# Patient Record
Sex: Male | Born: 1970 | Race: Black or African American | Hispanic: No | Marital: Married | Smoking: Never smoker
Health system: Southern US, Community
[De-identification: ages and names within clinical notes are randomized; demographics above are authoritative.]

## PROBLEM LIST (undated history)

## (undated) ENCOUNTER — Emergency Department (HOSPITAL_COMMUNITY): Admission: EM | Payer: Self-pay | Source: Home / Self Care

## (undated) DIAGNOSIS — M545 Low back pain, unspecified: Secondary | ICD-10-CM

## (undated) DIAGNOSIS — I10 Essential (primary) hypertension: Secondary | ICD-10-CM

## (undated) DIAGNOSIS — R030 Elevated blood-pressure reading, without diagnosis of hypertension: Secondary | ICD-10-CM

## (undated) DIAGNOSIS — E669 Obesity, unspecified: Secondary | ICD-10-CM

## (undated) DIAGNOSIS — G4733 Obstructive sleep apnea (adult) (pediatric): Secondary | ICD-10-CM

## (undated) HISTORY — DX: Obesity, unspecified: E66.9

## (undated) HISTORY — DX: Low back pain: M54.5

## (undated) HISTORY — PX: TONSILECTOMY, ADENOIDECTOMY, BILATERAL MYRINGOTOMY AND TUBES: SHX2538

## (undated) HISTORY — DX: Elevated blood-pressure reading, without diagnosis of hypertension: R03.0

## (undated) HISTORY — DX: Obstructive sleep apnea (adult) (pediatric): G47.33

## (undated) HISTORY — DX: Low back pain, unspecified: M54.50

## (undated) HISTORY — DX: Essential (primary) hypertension: I10

---

## 2001-04-14 ENCOUNTER — Emergency Department (HOSPITAL_COMMUNITY): Admission: EM | Admit: 2001-04-14 | Discharge: 2001-04-14 | Payer: Self-pay

## 2001-10-18 ENCOUNTER — Encounter: Payer: Self-pay | Admitting: Emergency Medicine

## 2001-10-18 ENCOUNTER — Emergency Department (HOSPITAL_COMMUNITY): Admission: EM | Admit: 2001-10-18 | Discharge: 2001-10-18 | Payer: Self-pay | Admitting: Emergency Medicine

## 2006-11-21 ENCOUNTER — Emergency Department (HOSPITAL_COMMUNITY): Admission: EM | Admit: 2006-11-21 | Discharge: 2006-11-21 | Payer: Self-pay | Admitting: Emergency Medicine

## 2007-01-13 ENCOUNTER — Encounter: Admission: RE | Admit: 2007-01-13 | Discharge: 2007-01-13 | Payer: Self-pay | Admitting: Internal Medicine
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
## 2010-07-12 ENCOUNTER — Emergency Department (HOSPITAL_COMMUNITY): Admission: EM | Admit: 2010-07-12 | Discharge: 2010-07-12 | Payer: Self-pay | Admitting: Emergency Medicine

## 2011-10-01 DIAGNOSIS — IMO0001 Reserved for inherently not codable concepts without codable children: Secondary | ICD-10-CM

## 2011-10-01 HISTORY — DX: Reserved for inherently not codable concepts without codable children: IMO0001

## 2013-06-06 ENCOUNTER — Telehealth: Payer: Self-pay | Admitting: General Surgery

## 2013-06-06 NOTE — Telephone Encounter (Signed)
Message copied by Nita Sells on Wed Jun 06, 2013 11:01 AM ------      Message from: Armanda Magic R      Created: Wed May 30, 2013  8:58 PM       Please let patient know that he had successful CPAP titration and set up CPAP with [facility] with Resmed CPAP at 14cm H2O with heated humidity and medium Resmed Airfit F10 mask and set up OV with me in 10 weeks ------

## 2013-06-06 NOTE — Telephone Encounter (Signed)
Pt is aware. Faxed to [employer]. To scheduling to schedule 10 week f/u

## 2013-06-14 ENCOUNTER — Encounter: Payer: Self-pay | Admitting: General Surgery

## 2013-06-14 ENCOUNTER — Telehealth: Payer: Self-pay | Admitting: General Surgery

## 2013-06-14 NOTE — Telephone Encounter (Signed)
successful CPAP titration and set up CPAP with [facility] with Resmed CPAP at 14cm H2O with heated humidity and medium Resmed Airfit F10 mask and set up OV with me in 10 weeks   LVM for pt to return call.

## 2013-06-14 NOTE — Telephone Encounter (Signed)
Message copied by Nita Sells on Thu Jun 14, 2013  9:36 AM ------      Message from: Armanda Magic R      Created: Wed May 30, 2013  8:58 PM       Please let patient know that he had successful CPAP titration and set up CPAP with [facility] with Resmed CPAP at 14cm H2O with heated humidity and medium Resmed Airfit F10 mask and set up OV with me in 10 weeks ------

## 2013-06-14 NOTE — Telephone Encounter (Signed)
Sent letter for pt to call us and [facility] due to many unreturned calls.

## 2013-08-08 ENCOUNTER — Telehealth: Payer: Self-pay | Admitting: Cardiology

## 2013-08-08 NOTE — Telephone Encounter (Signed)
Spoke with pt and he has not received his CPAP machine yet. I called and spoke to [facility] and she stated that he has missed two CPAP set up appointments. She stated she would call him to r/s appointment. I will r/s his 10 week f/u for another ten weeks from now.

## 2013-08-08 NOTE — Telephone Encounter (Signed)
New Message  Pt called was unaware of why he had a follow up appt on 12/15.Marland Kitchen Requests a call back to discuss why this appt is needed// Please call

## 2013-08-08 NOTE — Telephone Encounter (Signed)
[facility] and r/s appt for 10/22/13 at 3:45

## 2013-08-13 ENCOUNTER — Ambulatory Visit: Payer: Self-pay | Admitting: Cardiology

## 2013-10-19 ENCOUNTER — Encounter: Payer: Self-pay | Admitting: General Surgery

## 2013-10-19 DIAGNOSIS — G4733 Obstructive sleep apnea (adult) (pediatric): Secondary | ICD-10-CM

## 2013-10-22 ENCOUNTER — Ambulatory Visit: Payer: Self-pay | Admitting: Cardiology

## 2014-05-30 ENCOUNTER — Encounter: Payer: Self-pay | Admitting: Cardiology

## 2015-02-13 ENCOUNTER — Ambulatory Visit (INDEPENDENT_AMBULATORY_CARE_PROVIDER_SITE_OTHER): Payer: Self-pay | Admitting: Emergency Medicine

## 2015-02-13 VITALS — BP 130/80 | HR 77 | Temp 98.8°F | Resp 16 | Ht 72.5 in | Wt 309.0 lb

## 2015-02-13 DIAGNOSIS — Z024 Encounter for examination for driving license: Secondary | ICD-10-CM

## 2015-02-13 DIAGNOSIS — Z021 Encounter for pre-employment examination: Secondary | ICD-10-CM

## 2015-02-13 NOTE — Progress Notes (Signed)
RANEY SCHUBBE March 17, 1971 44 y.o.   Chief Complaint  Patient presents with  . Employment Physical    DOT       History of Present Illness:  Presents for a DOT examination.    ROS  Review of systems was reviewed and noncontributory  Allergies  Allergen Reactions  . Asa [Aspirin]     Nose bleed   . Bee Venom Swelling  . Penicillins     Childhood allergy      Current medications reviewed and updated. Past medical history, family history, social history have been reviewed and updated.   Physical Exam  GEN: WDWN, NAD, Non-toxic, A & O x 3 HEENT: Atraumatic, Normocephalic. Neck supple. No masses, No LAD. Ears and Nose: No external deformity. CV: RRR, No M/G/R. No JVD. No thrill. No extra heart sounds. PULM: CTA B, no wheezes, crackles, rhonchi. No retractions. No resp. distress. No accessory muscle use. ABD: S, NT, ND, +BS. No rebound. No HSM. EXTR: No c/c/e NEURO Normal gait.  PSYCH: Normally interactive. Conversant. Not depressed or anxious appearing.  Calm demeanor.   Assessment and Plan:  DOT examination. She has sleep apnea and did not bring a report of his use of the CPAP machine setting will follow up in the next 90 days and bring the reports when he extended his card for 4 year.  Carmelina Dane, M.D. Staff Physician [facility]

## 2017-11-09 ENCOUNTER — Encounter (INDEPENDENT_AMBULATORY_CARE_PROVIDER_SITE_OTHER): Payer: Self-pay

## 2017-11-09 ENCOUNTER — Encounter: Payer: Self-pay | Admitting: Neurology

## 2017-11-09 ENCOUNTER — Ambulatory Visit (INDEPENDENT_AMBULATORY_CARE_PROVIDER_SITE_OTHER): Payer: Managed Care, Other (non HMO) | Admitting: Neurology

## 2017-11-09 VITALS — BP 136/86 | HR 69 | Ht 74.0 in | Wt 295.0 lb

## 2017-11-09 DIAGNOSIS — G4726 Circadian rhythm sleep disorder, shift work type: Secondary | ICD-10-CM

## 2017-11-09 DIAGNOSIS — G4733 Obstructive sleep apnea (adult) (pediatric): Secondary | ICD-10-CM | POA: Diagnosis not present

## 2017-11-09 DIAGNOSIS — Z72820 Sleep deprivation: Secondary | ICD-10-CM | POA: Insufficient documentation

## 2017-11-09 DIAGNOSIS — G4719 Other hypersomnia: Secondary | ICD-10-CM | POA: Insufficient documentation

## 2017-11-09 NOTE — Patient Instructions (Signed)

## 2017-11-09 NOTE — Progress Notes (Signed)
SLEEP MEDICINE CLINIC   Provider:  Melvyn Novas, M D  Primary Care Physician:  Farris Has, MD   Referring Provider: Farris Has, MD   Chief Complaint  Patient presents with  . New Patient (Initial Visit)    pt wife, rm 20. pt states he had a sleep sutdy in 2014 and has been on CPAP since. he had issues with insurance problems with getting supplies and has not used the current CPAP in over a year. pt here to restart use machine. and get another sleep study.     HPI:  Shaun Scott is a 47 y.o. male patient formerly followed by Dr Earl Gala, and seen here in a referral from Dr. Farris Has for a new evaluation of sleep apnea.  I have the pleasure of meeting with Mr. and Mrs. Olander today on 09 November 2017, in order to help the patient reestablish with CPAP use if necessary.  [name] was evaluated at [facility] physicians in May 2014 for sleep apnea, his interpreting physician was Dr. Carolanne Grumbling.  At the time he was 47 years old, had a BMI of 37, and a high depression score.  He felt daytime sleepy.  Sleep efficiency during the test was 93% which is very high, REM onset after 86 minutes, sleep onset after only 2 minutes.  The patient slept equal amount of time on his back and on his sides, he had 14 central apneas, only 3 obstructive apneas and 77 hypo-apneas.  The lowest oxygen was 73% SPO2 and during REM sleep 68% SPO2.  His AHI was 15.3, and REM sleep exacerbated to 32.6, and his respiratory disturbance index was 22.1 and during REM sleep 35.8.  Given that the patient had prolonged oxygen desaturation REM exacerbation I would have considered him in the moderate to severe obstructive sleep apnea category due to his comorbidity.  Dr. Mayford Knife recommended a CPAP titration the patient returned for a CPAP titration of which I have no records.   he was prescribed CPAP after his sleep study evaluation, and was placed on an air sense ResMed #10 model CPAP.   He used it last in August 2018, after  running out of supplies.  The patient reports that his insurance company and the durable medical equipment company seem not to work together, he was asked to cover expenses that he could not at the time and thus became noncompliant.  The pressure for his machine was set at 14 cmH2O, his residual AHI was very low 0.5/h he would some nights have high air leaks, but overall CPAP was not uncomfortable for him.   His co-morbidities are listed as obesity, HTN, impaired glucose metabolism, back pain, shift work.   Chief complaint according to patient : " I work 2 jobs and sleep is fragmented "   Sleep habits are as follows: 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 He has commute for this one - 35 minutes.  This leaves only the morning from 8 AM to noon to sleep and his one hour lunch break.  He reports being able to sleep in the break room. He sleeps 4-5 hours at home.  His wife reports each and every time to snore. It's louder than in his  twenties. He sleeps in a cool, quiet and dark bedroom. Multiple pillows, and on his side , switches to supine.  Sometimes sleeping prone.   Sleep medical history and family sleep history: cousin maternal with OSA, Parents were snoring, mother was a shift Financial controller. Daytime sleepy.  He is a long time shift worker- and sleep deprived - ever since August 2018. Had a tonsillectomy in childhood, one event of sleep walking, one time night terrors.    Social history:  Works 2 jobs, regularly working nights. Sleeps in the day.  Married. One daughter at home. No smoking, non drinker, no drugs, drinks coffee 2-3/ day, soda in PM, iced tea- none. 5 hour- energy drink some days.   Review of Systems: Out of a complete 14 system review, the  patient complains of only the following symptoms, and all other reviewed systems are negative.   Loud snoring, bathroom breaks, high caffeine user.  Epworth score 9- 14 points / 24  , Fatigue severity score 38  , depression score n/a    Social History   Socioeconomic History  . Marital status: Married    Spouse name: Not on file  . Number of children: Not on file  . Years of education: Not on file  . Highest education level: Not on file  Social Needs  . Financial resource strain: Not on file  . Food insecurity - worry: Not on file  . Food insecurity - inability: Not on file  . Transportation needs - medical: Not on file  . Transportation needs - non-medical: Not on file  Occupational History  . Not on file  Tobacco Use  . Smoking status: Never Smoker  . Smokeless tobacco: Never Used  Substance and Sexual Activity  . Alcohol use: No  . Drug use: No  . Sexual activity: Not on file  Other Topics Concern  . Not on file  Social History Narrative  . Not on file    Family History  Problem Relation Age of Onset  . Hypertension Mother   . Diabetes Mother   . CVA Mother   . Kidney cancer Mother   . Asthma Father   . Asthma Brother   . Testicular cancer Brother   . Prostate cancer Brother     Past Medical History:  Diagnosis Date  . Elevated BP 10/2011   reading with out dx of HTN  . Hypertension   . Low back pain    Followed by Chiropractor  . Obesity   . Obstructive sleep apnea     Past Surgical History:  Procedure Laterality Date  . TONSILECTOMY, ADENOIDECTOMY, BILATERAL MYRINGOTOMY AND TUBES      Current Outpatient Medications  Medication Sig Dispense Refill  . EPINEPHrine (EPIPEN IJ) Inject as directed.    . hydrochlorothiazide (HYDRODIURIL) 25 MG tablet Take 25 mg by mouth daily.    . Multiple Vitamins-Minerals (MULTIVITAMIN GUMMIES ADULT PO) Take by mouth.     No current facility-administered medications for this visit.     Allergies as of  11/09/2017 - Review Complete 11/09/2017  Allergen Reaction Noted  . Asa [aspirin]  10/19/2013  . Bee venom Swelling 10/19/2013  . Penicillins  10/19/2013    Vitals: BP 136/86   Pulse 69   Ht 6\' 2"  (1.88 m)   Wt 295 lb (133.8 kg) Comment: wants it noted that he wearing steel toe boots  BMI 37.88 kg/m  Last Weight:  Wt Readings from Last 1 Encounters:  11/09/17 295 lb (133.8 kg)  ZOX:WRUE mass index is 37.88 kg/m.     Last Height:   Ht Readings from Last 1 Encounters:  11/09/17 6\' 2"  (1.88 m)    Physical exam:  General: The patient is awake, alert and appears not in acute distress. The patient is well groomed. Head: Normocephalic, atraumatic. Neck is supple. Mallampati 5 neck circumference: 18.25" Nasal airflow patent ,   Cardiovascular:  Regular rate and rhythm, without  murmurs or carotid bruit, and without distended neck veins. Respiratory: Lungs are clear to auscultation. Skin:  Without evidence of edema, or rashTrunk: BMI is 38. The patient's posture is erect.  Neurologic exam : The patient is drowsy, but oriented to place and time.   Memory subjective described as intact. Attention span & concentration ability appears normal.  Speech is fluent. Mood and affect are appropriate.  Cranial nerves: No change in smell and taste. Pupils are equal and briskly reactive to light. Funduscopic exam without evidence of pallor or edema. He reports vision with astigmatism and needs reading glasses. Extraocular movements  in vertical and horizontal planes intact and without nystagmus. Visual fields by finger perimetry are intact. Hearing to finger rub intact.  Facial sensation intact to fine touch. Facial motor strength is symmetric, but the patient has a right sided ptosis,  tongue and uvula move midline. Shoulder shrug was symmetrical.   Motor exam:   Normal tone, muscle bulk and symmetric strength in all extremities. Sensory:  Fine touch, pinprick and vibration were tested in all  extremities. Proprioception tested in the upper extremities was normal. Coordination: Rapid alternating movements in the fingers/hands was normal. Finger-to-nose maneuver  normal without evidence of ataxia, dysmetria or tremor. Gait and station: Patient walks without assistive device and is able unassisted to climb up to the exam table. Strength within normal limits. Stance is stable and normal. Turns with 3  Steps. Romberg testing is negative. Deep tendon reflexes: in the  upper and lower extremities are symmetric and intact.   Assessment:  After physical and neurologic examination, review of laboratory studies,  Personal review of imaging studies, reports of other /same  Imaging studies, results of polysomnography and / or neurophysiology testing and pre-existing records as far as provided in visit., my assessment is   1)    I will need to re-evaluate patient for the presence of OSA  In order to renew his CPAP supplies.  His wife described loud , thunderous snoring and high degree of sleepiness, more than he acknowleged.    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    3) His CPAP compliance will be problematic given his double job and limited sleep time.   The patient was advised of the nature of the diagnosed disorder , the treatment options and the  risks for general health and wellness arising from not treating the condition.   I spent more than 45 minutes of face to face time with the patient.  Greater than 50% of time was spent in counseling and coordination of care. We have discussed the diagnosis and differential and I answered the patient's questions.    Plan:  Treatment plan and additional workup :  1) Shift work sleep disorder- can only attend sleep lab Thursday and Friday . Will order HST instead.   2) He has all risk factors for apnea - BMI remains high, high neck size, Mallampati, and  witnessed snoring and apnea.  3) Both factors contribute to an Epworth of over 14 points.   DME was [facility],his CPA{P machine model by ResMed  Is an air sense 10 , he was a Full face mask user with facial hair- may be able to change a nasal mask.     Melvyn NovasARMEN Igor Bishop, MD 11/09/2017, 11:12 AM  Certified in Neurology by ABPN Certified in Sleep Medicine by [facility]  [facility] [address], [address] [place], [place] [id number]

## 2017-12-14 ENCOUNTER — Ambulatory Visit (INDEPENDENT_AMBULATORY_CARE_PROVIDER_SITE_OTHER): Payer: Managed Care, Other (non HMO) | Admitting: Neurology

## 2017-12-14 DIAGNOSIS — G4726 Circadian rhythm sleep disorder, shift work type: Secondary | ICD-10-CM

## 2017-12-14 DIAGNOSIS — G4733 Obstructive sleep apnea (adult) (pediatric): Secondary | ICD-10-CM

## 2017-12-14 DIAGNOSIS — Z72820 Sleep deprivation: Secondary | ICD-10-CM

## 2017-12-14 DIAGNOSIS — G4719 Other hypersomnia: Secondary | ICD-10-CM

## 2017-12-27 ENCOUNTER — Other Ambulatory Visit: Payer: Self-pay | Admitting: Neurology

## 2017-12-27 ENCOUNTER — Telehealth: Payer: Self-pay | Admitting: Neurology

## 2017-12-27 NOTE — Telephone Encounter (Signed)
I called pt. I advised pt that Dr. Vickey Huger reviewed their sleep study results and found that pt has sleep apnea. Dr. Vickey Huger recommends that pt starts a auto CPAP. I reviewed PAP compliance expectations with the pt. Pt is agreeable to starting a CPAP. I advised pt that an order will be sent to a DME, [facility], and [facility] will call the pt within about one week after they file with the pt's insurance. [facility] will show the pt how to use the machine, fit for masks, and troubleshoot the CPAP if needed. A follow up appt was made for insurance purposes with Darrol Angel on Apr 05, 2018 at 8:45 am . Pt verbalized understanding to arrive 15 minutes early and bring their CPAP. A letter with all of this information in it will be mailed to the pt as a reminder. I verified with the pt that the address we have on file is correct. Pt verbalized understanding of results. Pt had no questions at this time but was encouraged to call back if questions arise.

## 2017-12-27 NOTE — Telephone Encounter (Signed)
-----   Message from Melvyn Novas, MD sent at 12/27/2017 11:26 AM EDT ----- This HST resulted in mild sleep apnea, and no clinically significant hypoxemia.  Weight loss is the best and long term treatment , and immediate therapy by use of a CPAP or dental device. I will offer an auto CPAP 5-12 cm water with nasal mask or pillow and heated humidity- patient will be asked to use this machine for 4 hours or more each day. Baird Lyons, please let him know that we can also arrange for a medical weight management program.     Cc Dr Kateri Plummer,

## 2017-12-27 NOTE — Procedures (Signed)
NAME:    [name]                                                                 DOB: August 08, 1971 MEDICAL RECORD No: [id number]                                             DOS: 12/14/2017 REFERRING PHYSICIAN: Farris Has, M.D. STUDY PERFORMED: Home Sleep Study on Apnea Link HISTORY: Shaun Scott is a 47 y.o. male patient formerly followed by Dr. Earl Gala, and seen here in a referral from Dr. Farris Has for a new evaluation of sleep apnea. I have the pleasure of meeting with [name] today on 09 November 2017, in order to help the patient reestablish with CPAP use if necessary. Patient is a shift worker with thunderous snoring.  PS:  Mr. Milliron was evaluated at [facility] physicians in May 2014 for sleep apnea, his interpreting physician was Dr. Carolanne Grumbling.  At the time he was 47 years old, had a BMI of 37, and a high depression score.  He felt daytime sleepy. Sleep efficiency during the test was 93% which is very high, REM onset after 86 minutes, sleep onset after only 2 minutes, he had 14 central apneas, only 3 obstructive apneas and 77 hypo-apneas.  The Nadir was 73% SpO2 and during REM sleep 68% SpO2.  His AHI was 15.3/h, and REM sleep exacerbated to 32.6, and his respiratory disturbance index was 22.1 and during REM sleep 35.8.  He had been prescribed CPAP after his sleep study evaluation, and was placed on FF and an air sense ResMed #10 model CPAP.  His co-morbidities are listed as obesity, HTN, impaired glucose metabolism, back pain, shift work. Epworth Sleepiness score endorsed at 9- 14 points / 24, Fatigue severity score at 38 points, and BMI is now 37.8.   STUDY RESULTS:  Total Recording Time:  6 h, 41 min. with valid test time of 3 hours and 45 minutes.  Total Apnea/Hypopnea Index (AHI): 6.9 /h; RDI was 11.5 /h. Average Oxygen Saturation:  94%; Lowest Oxygen Desaturation: 79 %  Total Time in Oxygen Saturation below 89 % was 3.0 minutes. Average Heart Rate:  65 bpm, regular (between  56 and 103 bpm). IMPRESSION: Very mild sleep apnea at AHI 6.9/h. No prolonged oxygen desaturation periods. Normal sinus rhythm and heart rate variability.  RECOMMENDATION: Primary treatment for this mild degree of sleep apnea with moderate snoring is weight loss. If the patient is excessively daytime sleepy, treatment with CPAP is indicated. The absence of hypoxemia would allow for a dental device to be used in apnea treatment.   Please let Mr. Skibicki know that we can immediately start on an auto CPAP 5-12 cm water, interface of choice ( facial hair - avoid FFM )  and comfort and offer to refer to a medical weight management program.  I certify that I have reviewed the raw data recording prior to the issuance of this report in accordance with the standards of the American Academy of Sleep Medicine (AASM). Melvyn Novas, M.D.    12-27-2017  Medical Director of [facility], accredited by the AASM. Diplomat of the ABPN and ABSM.

## 2017-12-27 NOTE — Addendum Note (Signed)
Addended by: Melvyn Novas on: 12/27/2017 11:26 AM   Modules accepted: Orders

## 2018-04-04 NOTE — Progress Notes (Deleted)
[facility]  PATIENT: Shaun Scott DOB: [birth year]   REASON FOR VISIT: Follow-up for newly diagnosed obstructive sleep apnea with initial CPAP HISTORY FROM:    HISTORY OF PRESENT ILLNESS: 3/13/19[name] is a 47 y.o. male patient formerly followed by Dr Earl Gala, and seen here in a referral from Dr. Farris Has for a new evaluation of sleep apnea.  I have the pleasure of meeting with Mr. and Mrs. Scott today on 09 November 2017, in order to help the patient reestablish with CPAP use if necessary.  Shaun Scott was evaluated at [facility] physicians in May 2014 for sleep apnea, his interpreting physician was Dr. Carolanne Grumbling.  At the time he was 47 years old, had a BMI of 37, and a high depression score.  He felt daytime sleepy.  Sleep efficiency during the test was 93% which is very high, REM onset after 86 minutes, sleep onset after only 2 minutes.  The patient slept equal amount of time on his back and on his sides, he had 14 central apneas, only 3 obstructive apneas and 77 hypo-apneas.  The lowest oxygen was 73% SPO2 and during REM sleep 68% SPO2.  His AHI was 15.3, and REM sleep exacerbated to 32.6, and his respiratory disturbance index was 22.1 and during REM sleep 35.8.  Given that the patient had prolonged oxygen desaturation REM exacerbation I would have considered him in the moderate to severe obstructive sleep apnea category due to his comorbidity.  Dr. Mayford Knife recommended a CPAP titration the patient returned for a CPAP titration of which I have no records.   he was prescribed CPAP after his sleep study evaluation, and was placed on an air sense ResMed #10 model CPAP.   He used it last in August 2018, after running out of supplies.  The patient reports that his insurance company and the durable medical equipment company seem not to work together, he was asked to cover expenses that he could not at the time and thus became noncompliant.  The pressure for his machine was  set at 14 cmH2O, his residual AHI was very low 0.5/h he would some nights have high air leaks, but overall CPAP was not uncomfortable for him.   His co-morbidities are listed as obesity, HTN, impaired glucose metabolism, back pain, shift work.   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 He has commute for this one - 35 minutes.  This leaves only the morning from 8 AM to noon to sleep and his one hour lunch break.  He reports being able to sleep in the break room. He sleeps 4-5 hours at home.  His wife reports each and every time to snore. It's louder than in his twenties. He sleeps in a cool, quiet and dark bedroom. Multiple pillows, and on his side , switches to supine.  Sometimes sleeping prone   REVIEW OF SYSTEMS: Full 14 system review of systems performed and notable only for those listed, all others are neg:  Constitutional: neg  Cardiovascular: neg Ear/Nose/Throat: neg  Skin: neg Eyes: neg Respiratory: neg Gastroitestinal: neg  Hematology/Lymphatic:  neg  Endocrine: neg Musculoskeletal:neg Allergy/Immunology: neg Neurological: neg Psychiatric: neg Sleep : neg   ALLERGIES: Allergies  Allergen Reactions  . Asa [Aspirin]     Nose bleed   . Bee Venom Swelling  . Penicillins     Childhood allergy     HOME MEDICATIONS: Outpatient Medications Prior to Visit  Medication Sig Dispense Refill  . EPINEPHrine (EPIPEN IJ) Inject as directed.    . hydrochlorothiazide (HYDRODIURIL) 25 MG tablet Take 25 mg by mouth daily.    . Multiple Vitamins-Minerals (MULTIVITAMIN GUMMIES ADULT PO) Take by mouth.     No facility-administered  medications prior to visit.     PAST MEDICAL HISTORY: Past Medical History:  Diagnosis Date  . Elevated BP 10/2011   reading with out dx of HTN  . Hypertension   . Low back pain    Followed by Chiropractor  . Obesity   . Obstructive sleep apnea     PAST SURGICAL HISTORY: Past Surgical History:  Procedure Laterality Date  . TONSILECTOMY, ADENOIDECTOMY, BILATERAL MYRINGOTOMY AND TUBES      FAMILY HISTORY: Family History  Problem Relation Age of Onset  . Hypertension Mother   . Diabetes Mother   . CVA Mother   . Kidney cancer Mother   . Asthma Father   . Asthma Brother   . Testicular cancer Brother   . Prostate cancer Brother     SOCIAL HISTORY: Social History   Socioeconomic History  . Marital status: Married    Spouse name: Not on file  . Number of children: Not on file  . Years of education: Not on file  . Highest education level: Not on file  Occupational History  . Not on file  Social Needs  . Financial resource strain: Not on file  . Food insecurity:    Worry: Not on file    Inability: Not on file  . Transportation needs:    Medical: Not on file    Non-medical: Not on file  Tobacco Use  . Smoking status: Never Smoker  . Smokeless tobacco: Never Used  Substance and Sexual Activity  . Alcohol use: No  . Drug use: No  . Sexual activity: Not on file  Lifestyle  . Physical activity:    Days per week: Not on file    Minutes per session: Not on file  . Stress: Not on file  Relationships  . Social connections:    Talks on phone: Not on file    Gets together: Not on file    Attends religious service: Not on file    Active member of club or organization: Not on file    Attends meetings of clubs or organizations: Not on file    Relationship status: Not on file  . Intimate partner violence:    Fear of current or ex partner: Not on file    Emotionally abused: Not on file    Physically abused: Not on file    Forced sexual activity: Not on file  Other  Topics Concern  . Not on file  Social History Narrative  . Not on file     PHYSICAL EXAM  There were no vitals filed for this visit. There is no height or weight on file to calculate BMI.  Generalized: Well developed, in no acute distress  Head: normocephalic and atraumatic,. Oropharynx benign  Neck: Supple, no carotid bruits  Cardiac: Regular rate rhythm, no murmur  Musculoskeletal: No deformity   Neurological examination   Mentation: Alert  oriented to time, place, history taking. Attention span and concentration appropriate. Recent and remote memory intact.  Follows all commands speech and language fluent.   Cranial nerve II-XII: Fundoscopic exam reveals sharp disc margins.Pupils were equal round reactive to light extraocular movements were full, visual field were full on confrontational test. Facial sensation and strength were normal. hearing was intact to finger rubbing bilaterally. Uvula tongue midline. head turning and shoulder shrug were normal and symmetric.Tongue protrusion into cheek strength was normal. Motor: normal bulk and tone, full strength in the BUE, BLE, fine finger movements normal, no pronator drift. No focal weakness Sensory: normal and symmetric to light touch, pinprick, and  Vibration, proprioception  Coordination: finger-nose-finger, heel-to-shin bilaterally, no dysmetria Reflexes: Brachioradialis 2/2, biceps 2/2, triceps 2/2, patellar 2/2, Achilles 2/2, plantar responses were flexor bilaterally. Gait and Station: Rising up from seated position without assistance, normal stance,  moderate stride, good arm swing, smooth turning, able to perform tiptoe, and heel walking without difficulty. Tandem gait is steady  DIAGNOSTIC DATA (LABS, IMAGING, TESTING) - ASSESSMENT AND PLAN  47 y.o. year old male  has a past medical history of Elevated BP (10/2011), Hypertension, Low back pain, Obesity, and Obstructive sleep apnea. here with ***    Cline Crock, [facility], APRN  [facility] [address] [place], [place] [address] [phone]

## 2018-04-05 ENCOUNTER — Ambulatory Visit: Payer: Self-pay | Admitting: Nurse Practitioner

## 2018-04-06 ENCOUNTER — Encounter: Payer: Self-pay | Admitting: Nurse Practitioner

## 2018-11-22 DIAGNOSIS — G4733 Obstructive sleep apnea (adult) (pediatric): Secondary | ICD-10-CM | POA: Diagnosis not present

## 2018-12-05 ENCOUNTER — Ambulatory Visit: Payer: Managed Care, Other (non HMO) | Admitting: Podiatry

## 2018-12-26 ENCOUNTER — Other Ambulatory Visit: Payer: Self-pay | Admitting: Podiatry

## 2018-12-26 ENCOUNTER — Other Ambulatory Visit: Payer: Self-pay

## 2018-12-26 ENCOUNTER — Encounter: Payer: Self-pay | Admitting: Podiatry

## 2018-12-26 ENCOUNTER — Ambulatory Visit (INDEPENDENT_AMBULATORY_CARE_PROVIDER_SITE_OTHER): Payer: BC Managed Care – PPO

## 2018-12-26 ENCOUNTER — Ambulatory Visit (INDEPENDENT_AMBULATORY_CARE_PROVIDER_SITE_OTHER): Payer: BC Managed Care – PPO | Admitting: Podiatry

## 2018-12-26 VITALS — Temp 98.2°F

## 2018-12-26 DIAGNOSIS — M2580 Other specified joint disorders, unspecified joint: Secondary | ICD-10-CM

## 2018-12-26 DIAGNOSIS — M79672 Pain in left foot: Secondary | ICD-10-CM | POA: Diagnosis not present

## 2018-12-26 DIAGNOSIS — Q828 Other specified congenital malformations of skin: Secondary | ICD-10-CM

## 2018-12-26 NOTE — Patient Instructions (Signed)
Keep the bandage on for 24 hours. At that time, remove and clean with soap and water. If it hurts or burns before 24 hours go ahead and remove the bandage and wash with soap and water. Keep the area clean. If there is any blistering cover with antibiotic ointment and a bandage. Monitor for any redness, drainage, or other signs of infection. Call the office if any are to occur. If you have any questions, please call the office at [phone].  

## 2018-12-28 NOTE — Progress Notes (Signed)
Subjective:   Patient ID: Shaun Scott, male   DOB: 48 y.o.   MRN: [id number]   HPI 48 year old male presents the office today for concerns of a painful skin lesion on the bottom of his left foot.  This is been ongoing for quite some time is been getting worse and hurts with shoes or pressure or if he stepped on something.  Has had no significant treatment recently.  Denies any redness or drainage or any swelling.  Denies of any foreign object.  He states he did have a fracture at 17 years ago but unsure where on his left foot.  He thinks that because the fracture he was told he needed to have surgery because of a callus.  He has no other concerns today.   Review of Systems  All other systems reviewed and are negative.  Past Medical History:  Diagnosis Date  . Elevated BP 10/2011   reading with out dx of HTN  . Hypertension   . Low back pain    Followed by Chiropractor  . Obesity   . Obstructive sleep apnea     Past Surgical History:  Procedure Laterality Date  . TONSILECTOMY, ADENOIDECTOMY, BILATERAL MYRINGOTOMY AND TUBES       Current Outpatient Medications:  .  hydrochlorothiazide (HYDRODIURIL) 25 MG tablet, Take 25 mg by mouth daily., Disp: , Rfl:   Allergies  Allergen Reactions  . Asa [Aspirin]     Nose bleed   . Bee Venom Swelling  . Penicillins     Childhood allergy          Objective:  Physical Exam  General: AAO x3, NAD  Dermatological: Punctate annular hyperkeratotic lesion on the left foot underneath the lateral sesamoid.  Upon debridement there is no ongoing ulceration, drainage or signs of infection no evidence of foreign body was identified today.  No open lesions.  Vascular: Dorsalis Pedis artery and Posterior Tibial artery pedal pulses are 2/4 bilateral with immedate capillary fill time.There is no pain with calf compression, swelling, warmth, erythema.   Neruologic: Grossly intact via light touch bilateral. Protective threshold with Semmes  Wienstein monofilament intact to all pedal sites bilateral.   Musculoskeletal: Prominence of the sesamoid complex.  Tenderness on the plantar aspect of the sesamoids.  No other areas of tenderness.  Muscular strength 5/5 in all groups tested bilateral.  Gait: Unassisted, Nonantalgic.       Assessment:   Porokeratosis left foot, sesamoiditis     Plan:  -Treatment options discussed including all alternatives, risks, and complications -Etiology of symptoms were discussed -X-rays were obtained and reviewed with the patient. There is no evidence of acute fracture or stress fracture.  No foreign body present. -I sharply debrided the hyperkeratotic lesion to the any complications or bleeding.  Areas cleaned with alcohol and a pad was placed followed by salicylic acid and a bandage.  Post procedure instructions were discussed.  Monitor for any signs or symptoms of infection. -We discussed offloading mechanisms for him.  Custom orthotics help offload the area.  Certainly has similar area in the contralateral extremity as well.  Also discussed surgical intervention in the future if symptoms continue.  For now continue conservative care. -Check insurance coverage   Vivi Barrack DPM

## 2019-02-05 ENCOUNTER — Encounter: Payer: Self-pay | Admitting: Podiatry

## 2019-02-05 ENCOUNTER — Ambulatory Visit (INDEPENDENT_AMBULATORY_CARE_PROVIDER_SITE_OTHER): Payer: Managed Care, Other (non HMO) | Admitting: Podiatry

## 2019-02-05 ENCOUNTER — Ambulatory Visit (INDEPENDENT_AMBULATORY_CARE_PROVIDER_SITE_OTHER): Payer: Managed Care, Other (non HMO)

## 2019-02-05 ENCOUNTER — Ambulatory Visit: Payer: Managed Care, Other (non HMO) | Admitting: Orthotics

## 2019-02-05 ENCOUNTER — Other Ambulatory Visit: Payer: Self-pay

## 2019-02-05 VITALS — Temp 98.1°F

## 2019-02-05 DIAGNOSIS — M779 Enthesopathy, unspecified: Secondary | ICD-10-CM | POA: Diagnosis not present

## 2019-02-05 DIAGNOSIS — M79672 Pain in left foot: Secondary | ICD-10-CM

## 2019-02-05 DIAGNOSIS — M7989 Other specified soft tissue disorders: Secondary | ICD-10-CM

## 2019-02-05 DIAGNOSIS — M84375A Stress fracture, left foot, initial encounter for fracture: Secondary | ICD-10-CM | POA: Diagnosis not present

## 2019-02-05 DIAGNOSIS — M2580 Other specified joint disorders, unspecified joint: Secondary | ICD-10-CM

## 2019-02-05 DIAGNOSIS — Q828 Other specified congenital malformations of skin: Secondary | ICD-10-CM

## 2019-02-05 MED ORDER — MELOXICAM 15 MG PO TABS: 15.0000 mg | ORAL_TABLET | Freq: Every day | ORAL | 0 refills | Status: DC

## 2019-02-05 NOTE — Progress Notes (Signed)
Didn't see for f/o

## 2019-02-05 NOTE — Progress Notes (Signed)
Subjective: 48 year old male presents the office today for concerns of pain and swelling to his left foot.  He states about 4 to 5 days ago he was walking he felt that he may have heard or felt a pop in his left foot.  He does have a history of 2 hairline fractures he states in the left foot.  He states that he is recently tried increasing his walking for exercise. Denies any systemic complaints such as fevers, chills, nausea, vomiting. No acute changes since last appointment, and no other complaints at this time.   Objective: AAO x3, NAD DP/PT pulses palpable bilaterally, CRT less than 3 seconds There is mild tenderness palpation diffusely along the dorsal midfoot area.  There is mild edema of the foot there is no erythema or warmth.  There is no pain with ankle joint.  Flexor, extensor tendons appear to be intact.  The callus on the left foot is been doing well. No open lesions or pre-ulcerative lesions.  No pain with calf compression, swelling, warmth, erythema  Assessment: Concern for stress fracture left foot  Plan: -All treatment options discussed with the patient including all alternatives, risks, complications.  -X-rays obtained reviewed.  Questionable area of radiolucency in the third metatarsal base.  No other evidence of acute fracture identified. -Given the discomfort as well as the swelling on the placed into a cam boot for concern of fracture.  He had 2 other fractures in his foot previously. -Ice elevation -Prescribed mobic. Discussed side effects of the medication and directed to stop if any are to occur and call the office.  -Patient encouraged to call the office with any questions, concerns, change in symptoms.   Trula Slade DPM

## 2019-02-09 ENCOUNTER — Telehealth: Payer: Self-pay | Admitting: *Deleted

## 2019-02-09 NOTE — Telephone Encounter (Signed)
Pt states he is taking the medication and the swelling is not going down.

## 2019-02-09 NOTE — Telephone Encounter (Signed)
I tried calling the patient. No answer. Left VM to call back.   I would continue with the CAM Boot. He could also come in for an [place] boot. If not improving would recommend an MRI.

## 2019-02-12 NOTE — Telephone Encounter (Signed)
I called pt and informed that Dr. Jacqualyn Posey wanted him to continue with the cam boot, rest and elevate as much as possible and we would see him on 02/20/2019.

## 2019-02-20 ENCOUNTER — Encounter: Payer: Self-pay | Admitting: Podiatry

## 2019-02-20 ENCOUNTER — Ambulatory Visit (INDEPENDENT_AMBULATORY_CARE_PROVIDER_SITE_OTHER): Payer: Managed Care, Other (non HMO) | Admitting: Podiatry

## 2019-02-20 ENCOUNTER — Ambulatory Visit (INDEPENDENT_AMBULATORY_CARE_PROVIDER_SITE_OTHER): Payer: Managed Care, Other (non HMO)

## 2019-02-20 ENCOUNTER — Other Ambulatory Visit: Payer: Self-pay

## 2019-02-20 VITALS — Temp 98.0°F

## 2019-02-20 DIAGNOSIS — R2242 Localized swelling, mass and lump, left lower limb: Secondary | ICD-10-CM

## 2019-02-20 DIAGNOSIS — M84375A Stress fracture, left foot, initial encounter for fracture: Secondary | ICD-10-CM | POA: Diagnosis not present

## 2019-02-20 DIAGNOSIS — M84375D Stress fracture, left foot, subsequent encounter for fracture with routine healing: Secondary | ICD-10-CM | POA: Diagnosis not present

## 2019-02-21 NOTE — Progress Notes (Signed)
Subjective: 48 year old male presents the office today for follow-up valuation possible stress fracture of his left foot.  He states he still been wearing the cam boot overall he is doing much better he is not having any pain or swelling.  At max his pain level is 1/10.  No recent injury trauma any changes since I last saw him.  He has no new concerns today. Denies any systemic complaints such as fevers, chills, nausea, vomiting. No acute changes since last appointment, and no other complaints at this time.   Objective: AAO x3, NAD DP/PT pulses palpable bilaterally, CRT less than 3 seconds At this time there is no area of tenderness elicited to the left foot.  Particular there is no pain in the metatarsals.  Ankle, subtalar joint range of motion intact but any pain.  There is mild swelling there is no erythema or warmth.  No other areas of tenderness elicited at this time. No open lesions or pre-ulcerative lesions.  No pain with calf compression, swelling, warmth, erythema  Assessment: Resolving left foot pain with much improvement  Plan: -All treatment options discussed with the patient including all alternatives, risks, complications.  -Repeat x-rays were obtained reviewed.  No evidence of acute fracture identified today. -We started transition back into a regular shoe over the next week to make a gradual transition.  Continue compression anklet to help with swelling.  Ice elevation.  There is any increase in pain to return to the cam boot and to let me know. -Patient encouraged to call the office with any questions, concerns, change in symptoms.   Trula Slade DPM

## 2019-03-15 DIAGNOSIS — G4733 Obstructive sleep apnea (adult) (pediatric): Secondary | ICD-10-CM | POA: Diagnosis not present

## 2019-03-20 ENCOUNTER — Other Ambulatory Visit: Payer: Self-pay

## 2019-03-20 ENCOUNTER — Ambulatory Visit (INDEPENDENT_AMBULATORY_CARE_PROVIDER_SITE_OTHER): Payer: BC Managed Care – PPO

## 2019-03-20 ENCOUNTER — Ambulatory Visit (INDEPENDENT_AMBULATORY_CARE_PROVIDER_SITE_OTHER): Payer: BC Managed Care – PPO | Admitting: Podiatry

## 2019-03-20 VITALS — Temp 98.3°F

## 2019-03-20 DIAGNOSIS — M722 Plantar fascial fibromatosis: Secondary | ICD-10-CM | POA: Diagnosis not present

## 2019-03-20 DIAGNOSIS — M84375D Stress fracture, left foot, subsequent encounter for fracture with routine healing: Secondary | ICD-10-CM

## 2019-03-20 DIAGNOSIS — M2580 Other specified joint disorders, unspecified joint: Secondary | ICD-10-CM

## 2019-03-20 NOTE — Patient Instructions (Signed)
Ankle Sprain, Phase I Rehab An ankle sprain is an injury to the ligaments of your ankle. Ankle sprains cause stiffness, loss of motion, and loss of strength. Ask your health care provider which exercises are safe for you. Do exercises exactly as told by your health care provider and adjust them as directed. It is normal to feel mild stretching, pulling, tightness, or discomfort as you do these exercises. Stop right away if you feel sudden pain or your pain gets worse. Do not begin these exercises until told by your health care provider. Stretching and range-of-motion exercises These exercises warm up your muscles and joints and improve the movement and flexibility of your lower leg and ankle. These exercises also help to relieve pain and stiffness. Gastroc and soleus stretch This exercise is also called a calf stretch. It stretches the muscles in the back of the lower leg. These muscles are the gastrocnemius, or gastroc, and the soleus. 1. Sit on the floor with your left / right leg extended. 2. Loop a belt or towel around the ball of your left / right foot. The ball of your foot is on the walking surface, right under your toes. 3. Keep your left / right ankle and foot relaxed and keep your knee straight while you use the belt or towel to pull your foot toward you. You should feel a gentle stretch behind your calf or knee in your gastroc muscle. 4. Hold this position for __________ seconds, then release to the starting position. 5. Repeat the exercise with your knee bent. You can put a pillow or a rolled bath towel under your knee to support it. You should feel a stretch deep in your calf in the soleus muscle or at your Achilles tendon. Repeat __________ times. Complete this exercise __________ times a day. Ankle alphabet  1. Sit with your left / right leg supported at the lower leg. ? Do not rest your foot on anything. ? Make sure your foot has room to move freely. 2. Think of your left / right  foot as a paintbrush. ? Move your foot to trace each letter of the alphabet in the air. Keep your hip and knee still while you trace. ? Make the letters as large as you can without feeling discomfort. 3. Trace every letter from A to Z. Repeat __________ times. Complete this exercise __________ times a day. Strengthening exercises These exercises build strength and endurance in your ankle and lower leg. Endurance is the ability to use your muscles for a long time, even after they get tired. Ankle dorsiflexion  1. Secure a rubber exercise band or tube to an object, such as a table leg, that will stay still when the band is pulled. Secure the other end around your left / right foot. 2. Sit on the floor facing the object, with your left / right leg extended. The band or tube should be slightly tense when your foot is relaxed. 3. Slowly bring your foot toward you, bringing the top of your foot toward your shin (dorsiflexion), and pulling the band tighter. 4. Hold this position for __________ seconds. 5. Slowly return your foot to the starting position. Repeat __________ times. Complete this exercise __________ times a day. Ankle plantar flexion  1. Sit on the floor with your left / right leg extended. 2. Loop a rubber exercise tube or band around the ball of your left / right foot. The ball of your foot is on the walking surface, right under your   toes. ? Hold the ends of the band or tube in your hands. ? The band or tube should be slightly tense when your foot is relaxed. 3. Slowly point your foot and toes downward to tilt the top of your foot away from your shin (plantar flexion). 4. Hold this position for __________ seconds. 5. Slowly return your foot to the starting position. Repeat __________ times. Complete this exercise __________ times a day. Ankle eversion 1. Sit on the floor with your legs straight out in front of you. 2. Loop a rubber exercise band or tube around the ball of your left  / right foot. The ball of your foot is on the walking surface, right under your toes. ? Hold the ends of the band in your hands, or secure the band to a stable object. ? The band or tube should be slightly tense when your foot is relaxed. 3. Slowly push your foot outward, away from your other leg (eversion). 4. Hold this position for __________ seconds. 5. Slowly return your foot to the starting position. Repeat __________ times. Complete this exercise __________ times a day. This information is not intended to replace advice given to you by your health care provider. Make sure you discuss any questions you have with your health care provider. Document Released: 03/17/2005 Document Revised: 12/05/2018 Document Reviewed: 05/29/2018 Elsevier Patient Education  2020 [employer]  

## 2019-03-20 NOTE — Progress Notes (Signed)
Subjective: 48 year old male presents the office today for follow-up evaluation of left foot pain, stress fracture.  States that he no discomfort.  He has no pain in the foot.  He was discussed shoe modifications because he wants to get back into working out and also wants to discuss orthotics.  States he has no some popping to his ankles in the morning but is been a chronic issue he has no pain with it and this was ongoing prior to this injury.  No swelling to the foot or ankle. Denies any systemic complaints such as fevers, chills, nausea, vomiting. No acute changes since last appointment, and no other complaints at this time.   Objective: AAO x3, NAD DP/PT pulses palpable bilaterally, CRT less than 3 seconds At this time there is no tenderness palpation of the dorsal aspect left foot.  There is no area tenderness to the foot or ankle edema, erythema.  Decreased medial arch height.  Occasional discomfort in the arch of the foot which is been chronic but no pain today.  Ankle, subtalar joint range of motion intact.  No pain is has moist today. No open lesions or pre-ulcerative lesions.  No pain with calf compression, swelling, warmth, erythema  Assessment: Healed stress fracture left foot  Plan: -All treatment options discussed with the patient including all alternatives, risks, complications.  -X-rays obtained reviewed.  No evidence of acute fracture or stress fracture.  We discussed returning to a more supportive shoe as well as general stretching exercises.  I gave him exercises for ankle sprain although he does not have an ankle sprain.  Will check orthotic coverage as well for him.  We will contact him.  In the meantime we will work on getting a more supportive shoe. -Patient encouraged to call the office with any questions, concerns, change in symptoms.   Trula Slade DPM

## 2019-03-22 DIAGNOSIS — Z91030 Bee allergy status: Secondary | ICD-10-CM | POA: Diagnosis not present

## 2019-03-22 DIAGNOSIS — R7309 Other abnormal glucose: Secondary | ICD-10-CM | POA: Diagnosis not present

## 2019-03-22 DIAGNOSIS — Z125 Encounter for screening for malignant neoplasm of prostate: Secondary | ICD-10-CM | POA: Diagnosis not present

## 2019-03-22 DIAGNOSIS — I10 Essential (primary) hypertension: Secondary | ICD-10-CM | POA: Diagnosis not present

## 2019-03-24 ENCOUNTER — Encounter: Payer: Self-pay | Admitting: Podiatry

## 2019-03-28 ENCOUNTER — Other Ambulatory Visit: Payer: Managed Care, Other (non HMO) | Admitting: Orthotics

## 2019-04-03 ENCOUNTER — Other Ambulatory Visit: Payer: Self-pay

## 2019-04-03 ENCOUNTER — Ambulatory Visit: Payer: BC Managed Care – PPO | Admitting: Orthotics

## 2019-04-03 DIAGNOSIS — M2580 Other specified joint disorders, unspecified joint: Secondary | ICD-10-CM | POA: Diagnosis not present

## 2019-04-03 DIAGNOSIS — M722 Plantar fascial fibromatosis: Secondary | ICD-10-CM | POA: Diagnosis not present

## 2019-04-03 DIAGNOSIS — M84375D Stress fracture, left foot, subsequent encounter for fracture with routine healing: Secondary | ICD-10-CM

## 2019-04-03 NOTE — Progress Notes (Signed)
Patient came into today to be cast for Custom Foot Orthotics. Upon recommendation of Dr. Jacqualyn Posey  Patient presents with foot pain, stress fracture left Goals are RF stability, long arch support, ff cushioning Plan vendor [place]

## 2019-04-21 ENCOUNTER — Other Ambulatory Visit: Payer: Self-pay

## 2019-04-21 DIAGNOSIS — Z20822 Contact with and (suspected) exposure to covid-19: Secondary | ICD-10-CM

## 2019-04-22 LAB — NOVEL CORONAVIRUS, NAA: SARS-CoV-2, NAA: NOT DETECTED

## 2019-04-24 ENCOUNTER — Other Ambulatory Visit: Payer: BC Managed Care – PPO | Admitting: Orthotics

## 2019-05-03 DIAGNOSIS — K5900 Constipation, unspecified: Secondary | ICD-10-CM | POA: Diagnosis not present

## 2019-05-15 ENCOUNTER — Other Ambulatory Visit: Payer: Self-pay

## 2019-05-15 ENCOUNTER — Ambulatory Visit: Payer: BC Managed Care – PPO | Admitting: Orthotics

## 2019-05-15 DIAGNOSIS — M2580 Other specified joint disorders, unspecified joint: Secondary | ICD-10-CM

## 2019-05-15 DIAGNOSIS — M84375D Stress fracture, left foot, subsequent encounter for fracture with routine healing: Secondary | ICD-10-CM

## 2019-05-15 DIAGNOSIS — M722 Plantar fascial fibromatosis: Secondary | ICD-10-CM

## 2019-05-15 NOTE — Progress Notes (Signed)
Patient came in today to pick up custom made foot orthotics.  The goals were accomplished and the patient reported no dissatisfaction with said orthotics.  Patient was advised of breakin period and how to report any issues. 

## 2019-06-07 DIAGNOSIS — Z01818 Encounter for other preprocedural examination: Secondary | ICD-10-CM | POA: Diagnosis not present

## 2019-06-18 DIAGNOSIS — Z8042 Family history of malignant neoplasm of prostate: Secondary | ICD-10-CM | POA: Diagnosis not present

## 2019-06-18 DIAGNOSIS — I10 Essential (primary) hypertension: Secondary | ICD-10-CM | POA: Diagnosis not present

## 2019-06-18 DIAGNOSIS — R7309 Other abnormal glucose: Secondary | ICD-10-CM | POA: Diagnosis not present

## 2019-06-18 DIAGNOSIS — Z6841 Body Mass Index (BMI) 40.0 and over, adult: Secondary | ICD-10-CM | POA: Diagnosis not present

## 2019-07-13 DIAGNOSIS — Z1159 Encounter for screening for other viral diseases: Secondary | ICD-10-CM | POA: Diagnosis not present

## 2019-07-18 DIAGNOSIS — Z1211 Encounter for screening for malignant neoplasm of colon: Secondary | ICD-10-CM | POA: Diagnosis not present

## 2019-07-18 DIAGNOSIS — K640 First degree hemorrhoids: Secondary | ICD-10-CM | POA: Diagnosis not present

## 2019-08-03 DIAGNOSIS — G4733 Obstructive sleep apnea (adult) (pediatric): Secondary | ICD-10-CM | POA: Diagnosis not present

## 2019-09-13 DIAGNOSIS — G4733 Obstructive sleep apnea (adult) (pediatric): Secondary | ICD-10-CM | POA: Diagnosis not present

## 2019-10-16 DIAGNOSIS — F338 Other recurrent depressive disorders: Secondary | ICD-10-CM | POA: Diagnosis not present

## 2019-10-22 DIAGNOSIS — F338 Other recurrent depressive disorders: Secondary | ICD-10-CM | POA: Diagnosis not present

## 2019-10-23 DIAGNOSIS — F338 Other recurrent depressive disorders: Secondary | ICD-10-CM | POA: Diagnosis not present

## 2019-10-24 DIAGNOSIS — F338 Other recurrent depressive disorders: Secondary | ICD-10-CM | POA: Diagnosis not present

## 2019-10-29 DIAGNOSIS — F338 Other recurrent depressive disorders: Secondary | ICD-10-CM | POA: Diagnosis not present

## 2019-10-30 DIAGNOSIS — F338 Other recurrent depressive disorders: Secondary | ICD-10-CM | POA: Diagnosis not present

## 2019-10-31 DIAGNOSIS — F338 Other recurrent depressive disorders: Secondary | ICD-10-CM | POA: Diagnosis not present

## 2019-11-07 DIAGNOSIS — F338 Other recurrent depressive disorders: Secondary | ICD-10-CM | POA: Diagnosis not present

## 2019-11-12 DIAGNOSIS — Z1152 Encounter for screening for COVID-19: Secondary | ICD-10-CM | POA: Diagnosis not present

## 2019-11-12 DIAGNOSIS — B349 Viral infection, unspecified: Secondary | ICD-10-CM | POA: Diagnosis not present

## 2019-11-12 DIAGNOSIS — R1111 Vomiting without nausea: Secondary | ICD-10-CM | POA: Diagnosis not present

## 2019-11-12 DIAGNOSIS — J029 Acute pharyngitis, unspecified: Secondary | ICD-10-CM | POA: Diagnosis not present

## 2019-11-14 DIAGNOSIS — F338 Other recurrent depressive disorders: Secondary | ICD-10-CM | POA: Diagnosis not present

## 2020-02-15 DIAGNOSIS — R7309 Other abnormal glucose: Secondary | ICD-10-CM | POA: Diagnosis not present

## 2020-02-15 DIAGNOSIS — I10 Essential (primary) hypertension: Secondary | ICD-10-CM | POA: Diagnosis not present

## 2020-02-15 DIAGNOSIS — R609 Edema, unspecified: Secondary | ICD-10-CM | POA: Diagnosis not present

## 2020-03-10 ENCOUNTER — Ambulatory Visit (INDEPENDENT_AMBULATORY_CARE_PROVIDER_SITE_OTHER): Payer: BC Managed Care – PPO | Admitting: Podiatry

## 2020-03-10 ENCOUNTER — Other Ambulatory Visit: Payer: Self-pay

## 2020-03-10 ENCOUNTER — Encounter: Payer: Self-pay | Admitting: Podiatry

## 2020-03-10 DIAGNOSIS — L601 Onycholysis: Secondary | ICD-10-CM | POA: Diagnosis not present

## 2020-03-10 DIAGNOSIS — Q828 Other specified congenital malformations of skin: Secondary | ICD-10-CM | POA: Diagnosis not present

## 2020-03-10 DIAGNOSIS — M79672 Pain in left foot: Secondary | ICD-10-CM | POA: Diagnosis not present

## 2020-03-10 NOTE — Patient Instructions (Signed)
Keep the bandage on for 24 hours. At that time, remove and clean with soap and water. If it hurts or burns before 24 hours go ahead and remove the bandage and wash with soap and water. Keep the area clean. If there is any blistering cover with antibiotic ointment and a bandage. Monitor for any redness, drainage, or other signs of infection. Call the office if any are to occur. If you have any questions, please call the office at [phone].  

## 2020-03-11 NOTE — Progress Notes (Signed)
Subjective: 49 year old male presents the office today for concerns of a callus on the bottom of his left foot, this point is a metatarsal 1 that is painful as well as his left third toenail did come off about 2.5 weeks ago.  He has not had no pain to the nail denies any redness or drainage or any swelling.  He states about 2 months ago he got the nail too short and got some blood underneath the nail and which likely caused to come off. Denies any systemic complaints such as fevers, chills, nausea, vomiting. No acute changes since last appointment, and no other complaints at this time.   Objective: AAO x3, NAD DP/PT pulses palpable bilaterally, CRT less than 3 seconds On the left third toe small amount of nail present on the base of the proximal nail fold.  It is well attached.  There is no edema, erythema or signs of infection.  There is no pain. On the left foot submetatarsal 1 is a thick hyperkeratotic annular lesion.  Upon debridement there is no underlying ulceration drainage or signs of infection or foreign body/verruca. No open lesions or pre-ulcerative lesions.  No pain with calf compression, swelling, warmth, erythema  Assessment: Porokeratosis left foot, onycholysis left third toe  Plan: -All treatment options discussed with the patient including all alternatives, risks, complications.  -Left third toenail appears to be healing well.  No signs of infection or pain today.  Monitoring signs or symptoms of infection or ingrown toenail. -Debrided hyperkeratotic lesion without any complications or bleeding.  Skin was cleaned with alcohol and a pad was placed followed by salicylic acid and a bandage.  Post procedure instructions discussed.  Monitor for any signs or symptoms of infection.  Offloading pad was modified in place to his insert. -Patient encouraged to call the office with any questions, concerns, change in symptoms.   Vivi Barrack DPM

## 2020-05-21 DIAGNOSIS — Z0000 Encounter for general adult medical examination without abnormal findings: Secondary | ICD-10-CM | POA: Diagnosis not present

## 2020-05-22 DIAGNOSIS — I10 Essential (primary) hypertension: Secondary | ICD-10-CM | POA: Diagnosis not present

## 2020-05-22 DIAGNOSIS — Z125 Encounter for screening for malignant neoplasm of prostate: Secondary | ICD-10-CM | POA: Diagnosis not present

## 2020-05-22 DIAGNOSIS — Z13220 Encounter for screening for lipoid disorders: Secondary | ICD-10-CM | POA: Diagnosis not present

## 2020-05-22 DIAGNOSIS — R7309 Other abnormal glucose: Secondary | ICD-10-CM | POA: Diagnosis not present

## 2020-07-09 DIAGNOSIS — G4733 Obstructive sleep apnea (adult) (pediatric): Secondary | ICD-10-CM | POA: Diagnosis not present

## 2020-08-25 DIAGNOSIS — Z1152 Encounter for screening for COVID-19: Secondary | ICD-10-CM | POA: Diagnosis not present

## 2020-08-25 DIAGNOSIS — U071 COVID-19: Secondary | ICD-10-CM | POA: Diagnosis not present

## 2020-12-02 ENCOUNTER — Encounter (HOSPITAL_COMMUNITY): Payer: Self-pay

## 2020-12-02 ENCOUNTER — Emergency Department (HOSPITAL_COMMUNITY)
Admission: EM | Admit: 2020-12-02 | Discharge: 2020-12-03 | Disposition: A | Discharge status: Home / Self Care | Payer: BC Managed Care – PPO | Attending: Emergency Medicine | Admitting: Emergency Medicine

## 2020-12-02 ENCOUNTER — Other Ambulatory Visit: Payer: Self-pay

## 2020-12-02 DIAGNOSIS — I10 Essential (primary) hypertension: Secondary | ICD-10-CM | POA: Insufficient documentation

## 2020-12-02 DIAGNOSIS — Y92410 Unspecified street and highway as the place of occurrence of the external cause: Secondary | ICD-10-CM | POA: Diagnosis not present

## 2020-12-02 DIAGNOSIS — S0990XA Unspecified injury of head, initial encounter: Secondary | ICD-10-CM | POA: Diagnosis not present

## 2020-12-02 DIAGNOSIS — Y999 Unspecified external cause status: Secondary | ICD-10-CM | POA: Insufficient documentation

## 2020-12-02 DIAGNOSIS — Y9389 Activity, other specified: Secondary | ICD-10-CM | POA: Diagnosis not present

## 2020-12-02 DIAGNOSIS — S0003XA Contusion of scalp, initial encounter: Secondary | ICD-10-CM

## 2020-12-02 DIAGNOSIS — Z79899 Other long term (current) drug therapy: Secondary | ICD-10-CM | POA: Diagnosis not present
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
## 2020-12-03 MED ORDER — CYCLOBENZAPRINE HCL 10 MG PO TABS: 10.0000 mg | ORAL_TABLET | Freq: Two times a day (BID) | ORAL | 0 refills | Status: DC | PRN

## 2020-12-03 MED ORDER — IBUPROFEN 600 MG PO TABS: 600.0000 mg | ORAL_TABLET | Freq: Four times a day (QID) | ORAL | 0 refills | Status: DC | PRN
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
## 2020-12-30 DIAGNOSIS — S46812A Strain of other muscles, fascia and tendons at shoulder and upper arm level, left arm, initial encounter: Secondary | ICD-10-CM | POA: Diagnosis not present

## 2021-01-13 DIAGNOSIS — M9901 Segmental and somatic dysfunction of cervical region: Secondary | ICD-10-CM | POA: Diagnosis not present

## 2021-01-13 DIAGNOSIS — M7918 Myalgia, other site: Secondary | ICD-10-CM | POA: Diagnosis not present

## 2021-01-13 DIAGNOSIS — M9902 Segmental and somatic dysfunction of thoracic region: Secondary | ICD-10-CM | POA: Diagnosis not present

## 2021-01-13 DIAGNOSIS — M542 Cervicalgia: Secondary | ICD-10-CM | POA: Diagnosis not present

## 2021-01-16 DIAGNOSIS — M7918 Myalgia, other site: Secondary | ICD-10-CM | POA: Diagnosis not present

## 2021-01-16 DIAGNOSIS — M9901 Segmental and somatic dysfunction of cervical region: Secondary | ICD-10-CM | POA: Diagnosis not present

## 2021-01-16 DIAGNOSIS — M542 Cervicalgia: Secondary | ICD-10-CM | POA: Diagnosis not present

## 2021-01-16 DIAGNOSIS — M9902 Segmental and somatic dysfunction of thoracic region: Secondary | ICD-10-CM | POA: Diagnosis not present

## 2021-01-27 DIAGNOSIS — M9901 Segmental and somatic dysfunction of cervical region: Secondary | ICD-10-CM | POA: Diagnosis not present

## 2021-01-27 DIAGNOSIS — M542 Cervicalgia: Secondary | ICD-10-CM | POA: Diagnosis not present

## 2021-01-27 DIAGNOSIS — M9902 Segmental and somatic dysfunction of thoracic region: Secondary | ICD-10-CM | POA: Diagnosis not present

## 2021-01-27 DIAGNOSIS — M7918 Myalgia, other site: Secondary | ICD-10-CM | POA: Diagnosis not present

## 2021-02-10 DIAGNOSIS — M9902 Segmental and somatic dysfunction of thoracic region: Secondary | ICD-10-CM | POA: Diagnosis not present

## 2021-02-10 DIAGNOSIS — M7918 Myalgia, other site: Secondary | ICD-10-CM | POA: Diagnosis not present

## 2021-02-10 DIAGNOSIS — M9901 Segmental and somatic dysfunction of cervical region: Secondary | ICD-10-CM | POA: Diagnosis not present

## 2021-02-10 DIAGNOSIS — M542 Cervicalgia: Secondary | ICD-10-CM | POA: Diagnosis not present

## 2021-02-22 DIAGNOSIS — M7022 Olecranon bursitis, left elbow: Secondary | ICD-10-CM | POA: Diagnosis not present

## 2021-02-22 DIAGNOSIS — R202 Paresthesia of skin: Secondary | ICD-10-CM | POA: Diagnosis not present

## 2021-04-07 ENCOUNTER — Ambulatory Visit (INDEPENDENT_AMBULATORY_CARE_PROVIDER_SITE_OTHER): Payer: BC Managed Care – PPO

## 2021-04-07 ENCOUNTER — Other Ambulatory Visit: Payer: Self-pay

## 2021-04-07 ENCOUNTER — Ambulatory Visit: Payer: Self-pay

## 2021-04-07 ENCOUNTER — Ambulatory Visit (INDEPENDENT_AMBULATORY_CARE_PROVIDER_SITE_OTHER): Payer: BC Managed Care – PPO | Admitting: Podiatry

## 2021-04-07 DIAGNOSIS — S92514A Nondisplaced fracture of proximal phalanx of right lesser toe(s), initial encounter for closed fracture: Secondary | ICD-10-CM

## 2021-04-07 DIAGNOSIS — Q828 Other specified congenital malformations of skin: Secondary | ICD-10-CM

## 2021-04-07 DIAGNOSIS — S90121A Contusion of right lesser toe(s) without damage to nail, initial encounter: Secondary | ICD-10-CM | POA: Diagnosis not present

## 2021-04-07 DIAGNOSIS — R52 Pain, unspecified: Secondary | ICD-10-CM

## 2021-04-07 NOTE — Patient Instructions (Signed)
Look for urea 40% cream or ointment and apply to the thickened dry skin / calluses. This can be bought over the counter, at a pharmacy or online such as [employer].  

## 2021-04-07 NOTE — Progress Notes (Signed)
  Subjective:  Patient ID: Shaun Scott, male    DOB: 05-06-1971,  MRN: [id number]  Chief Complaint  Patient presents with   Fracture    -(xray)Urgent Work in- fractured right 5th toe    50 y.o. male presents with the above complaint. History confirmed with patient.  Injury to 5 days ago he stubbed the toe.  He also has a painful lesion on the bottom of the left foot under the big toe  Objective:  Physical Exam: warm, good capillary refill, no trophic changes or ulcerative lesions, normal DP and PT pulses, and normal sensory exam. Left Foot: Submetatarsal 1 porokeratosis Right Foot: Pain and edema to right fifth toe  Radiographs: Multiple views x-ray of the right foot: Minimally displaced fracture of the neck of the proximal phalanx of the fifth toe Assessment:   1. Closed nondisplaced fracture of proximal phalanx of lesser toe of right foot, initial encounter   2. Porokeratosis   3. Pain      Plan:  Patient was evaluated and treated and all questions answered.  Discussed the toe fracture and reviewed my findings on the x-ray with him.  Recommended buddy splinting the toe and nonoperative treatment.  I demonstrated this for him.  Should heal uneventfully.  Advised may be swollen for some time.  He has been comfortable in shoes and I do not feel like it required a surgical shoe.  All symptomatic hyperkeratoses were safely debrided with a sterile #15 blade to patient's level of comfort without incident. We discussed preventative and palliative care of these lesions including supportive and accommodative shoegear, padding, prefabricated and custom molded accommodative orthoses, use of a pumice stone and lotions/creams daily.  Recommended urea cream on the callus on the left foot   Return if symptoms worsen or fail to improve.

## 2021-05-22 DIAGNOSIS — M9903 Segmental and somatic dysfunction of lumbar region: Secondary | ICD-10-CM | POA: Diagnosis not present

## 2021-05-22 DIAGNOSIS — M9901 Segmental and somatic dysfunction of cervical region: Secondary | ICD-10-CM | POA: Diagnosis not present

## 2021-05-22 DIAGNOSIS — M9905 Segmental and somatic dysfunction of pelvic region: Secondary | ICD-10-CM | POA: Diagnosis not present

## 2021-05-22 DIAGNOSIS — M9904 Segmental and somatic dysfunction of sacral region: Secondary | ICD-10-CM | POA: Diagnosis not present

## 2021-08-21 DIAGNOSIS — I10 Essential (primary) hypertension: Secondary | ICD-10-CM | POA: Diagnosis not present

## 2021-08-21 DIAGNOSIS — R7309 Other abnormal glucose: Secondary | ICD-10-CM | POA: Diagnosis not present

## 2021-08-21 DIAGNOSIS — G4733 Obstructive sleep apnea (adult) (pediatric): Secondary | ICD-10-CM | POA: Diagnosis not present

## 2021-08-21 DIAGNOSIS — Z125 Encounter for screening for malignant neoplasm of prostate: Secondary | ICD-10-CM | POA: Diagnosis not present

## 2021-11-11 ENCOUNTER — Ambulatory Visit: Payer: BC Managed Care – PPO | Admitting: Sports Medicine

## 2021-11-18 ENCOUNTER — Ambulatory Visit (INDEPENDENT_AMBULATORY_CARE_PROVIDER_SITE_OTHER): Payer: BC Managed Care – PPO | Admitting: Family Medicine

## 2021-11-18 VITALS — BP 141/85 | Ht 74.0 in | Wt 315.0 lb

## 2021-11-18 DIAGNOSIS — M7022 Olecranon bursitis, left elbow: Secondary | ICD-10-CM

## 2021-11-18 NOTE — Patient Instructions (Signed)
You have olecranon bursitis. ?Ice the area 3-4 times a day for 15 minutes at a time ?Compression typically for 1-2 weeks to help keep swelling down. ?We aspirated the fluid out of this today. ?Consider elbow pad for protection to prevent irritation and additional swelling. ?Follow up with me as needed. ? ?

## 2021-11-19 ENCOUNTER — Encounter: Payer: Self-pay | Admitting: Family Medicine

## 2021-11-19 NOTE — Progress Notes (Signed)
PCP: Farris Has, MD ? ?Subjective:  ? ?HPI: ?Patient is a 51 y.o. male here for left elbow swelling. ? ?Patient reports he recalls slipping in the yard several months ago and striking left elbow on the ground. ?Some pain initially but only mildly bothering him now. ?Slight numbness in 4th and 5th digits unrelated to this. ?He is right handed. ?No redness, fever, chills. ? ?Past Medical History:  ?Diagnosis Date  ? Elevated BP 10/2011  ? reading with out dx of HTN  ? Hypertension   ? Low back pain   ? Followed by Chiropractor  ? Obesity   ? Obstructive sleep apnea   ? ? ?Current Outpatient Medications on File Prior to Visit  ?Medication Sig Dispense Refill  ? amLODipine (NORVASC) 2.5 MG tablet Take 2.5 mg by mouth daily.    ? cyclobenzaprine (FLEXERIL) 10 MG tablet Take 1 tablet (10 mg total) by mouth 2 (two) times daily as needed for muscle spasms. 20 tablet 0  ? hydrochlorothiazide (HYDRODIURIL) 25 MG tablet Take 25 mg by mouth daily.    ? ibuprofen (ADVIL) 600 MG tablet Take 1 tablet (600 mg total) by mouth every 6 (six) hours as needed. 30 tablet 0  ? ?No current facility-administered medications on file prior to visit.  ? ? ?Past Surgical History:  ?Procedure Laterality Date  ? TONSILECTOMY, ADENOIDECTOMY, BILATERAL MYRINGOTOMY AND TUBES    ? ? ?Allergies  ?Allergen Reactions  ? Asa [Aspirin]   ?  Nose bleed ?  ? Bee Venom Swelling  ? Penicillins   ?  Childhood allergy ?  ? ? ?BP (!) 141/85   Ht 6\' 2"  (1.88 m)   Wt (!) 315 lb (142.9 kg)   BMI 40.44 kg/m?  ? ?   ? View : No data to display.  ?  ?  ?  ? ? ?   ? View : No data to display.  ?  ?  ?  ? ? ?    ?Objective:  ?Physical Exam: ? ?Gen: NAD, comfortable in exam room ? ?Left elbow: ?Swelling over olecranon.  No bruising, redness, warmth. ?FROM with 5/5 strength and no pain. ?No tenderness to palpation. ?NVI distally. ?Collateral ligaments intact. ?  ?Assessment & Plan:  ?1. Left olecranon bursitis - Discussed options and he would like to have this  drained so aspirated today.  Icing, compression, consider elbow pain.  F/u prn. ? ?After informed written consent timeout was performed.  Patient was seated on exam table.  Area overlying left olecranon bursa prepped with alcohol swab.  1.78mL lidocaine used for local anesthesia.  Then using an 18g needle on a 20cc syringe, 12mL of serosanguinous fluid was aspirated from his left olcranon bursa.  Patient tolerated procedure well without immediate complications. ?

## 2021-11-24 DIAGNOSIS — Z113 Encounter for screening for infections with a predominantly sexual mode of transmission: Secondary | ICD-10-CM | POA: Diagnosis not present

## 2021-11-24 DIAGNOSIS — G4733 Obstructive sleep apnea (adult) (pediatric): Secondary | ICD-10-CM | POA: Diagnosis not present

## 2021-11-24 DIAGNOSIS — Z13220 Encounter for screening for lipoid disorders: Secondary | ICD-10-CM | POA: Diagnosis not present

## 2021-11-24 DIAGNOSIS — E1169 Type 2 diabetes mellitus with other specified complication: Secondary | ICD-10-CM | POA: Diagnosis not present

## 2021-11-24 DIAGNOSIS — I10 Essential (primary) hypertension: Secondary | ICD-10-CM | POA: Diagnosis not present

## 2022-02-23 DIAGNOSIS — H6122 Impacted cerumen, left ear: Secondary | ICD-10-CM | POA: Diagnosis not present

## 2022-03-16 DIAGNOSIS — E1169 Type 2 diabetes mellitus with other specified complication: Secondary | ICD-10-CM | POA: Diagnosis not present

## 2022-03-16 DIAGNOSIS — G4733 Obstructive sleep apnea (adult) (pediatric): Secondary | ICD-10-CM | POA: Diagnosis not present

## 2022-03-16 DIAGNOSIS — Z6841 Body Mass Index (BMI) 40.0 and over, adult: Secondary | ICD-10-CM | POA: Diagnosis not present

## 2022-03-16 DIAGNOSIS — I10 Essential (primary) hypertension: Secondary | ICD-10-CM | POA: Diagnosis not present

## 2022-04-14 ENCOUNTER — Encounter: Payer: Self-pay | Admitting: Family Medicine

## 2022-04-14 ENCOUNTER — Ambulatory Visit (INDEPENDENT_AMBULATORY_CARE_PROVIDER_SITE_OTHER): Payer: BC Managed Care – PPO | Admitting: Family Medicine

## 2022-04-14 VITALS — BP 137/76 | Ht 74.0 in | Wt 341.0 lb

## 2022-04-14 DIAGNOSIS — M25562 Pain in left knee: Secondary | ICD-10-CM | POA: Diagnosis not present

## 2022-04-14 DIAGNOSIS — M25561 Pain in right knee: Secondary | ICD-10-CM

## 2022-04-14 MED ORDER — METHYLPREDNISOLONE ACETATE 40 MG/ML IJ SUSP
40.0000 mg | Freq: Once | INTRAMUSCULAR | Status: AC
Start: 2022-04-14 — End: 2022-04-14
  Administered 2022-04-14: 40 mg via INTRA_ARTICULAR

## 2022-04-14 MED ORDER — METHYLPREDNISOLONE ACETATE 40 MG/ML IJ SUSP
40.0000 mg | Freq: Once | INTRAMUSCULAR | Status: AC
  Administered 2022-04-14: 40 mg via INTRA_ARTICULAR

## 2022-04-14 NOTE — Progress Notes (Signed)
Shaun Scott - 51 y.o. male MRN [id number]  Date of birth: 07-Apr-1971    CHIEF COMPLAINT:   L and R knee pain    SUBJECTIVE:   HPI: Pleasant 51 year old male here for evaluation of bilateral knee pain.  His knees have been bothering him for 3 to 4 months now.  He reports pain in the anterior part of the knee underneath the kneecap.  He says in the last couple months he has been trying to do more walking to lose weight and lower his blood pressure and his knees have been hurting with walking.  His right knee started hurting the most so he compensated by putting more weight on his left leg and now his left knee hurts as well.  Last week he walked 3 miles and was in a significant amount of pain the next day to the point where he can barely walk.  He denies any mechanical symptoms such as catching or locking of the knees.  He has tried taking ibuprofen as needed with minimal relief.  He reports remote injury to the right knee about 7 years ago when he twisted and felt a "pop" but he never had any evaluated and it eventually got better on its own.   ROS:     See HPI  PERTINENT  PMH / PSH FH / / SH:  Past Medical, Surgical, Social, and Family History Reviewed & Updated in the EMR.  Pertinent findings include:  none  OBJECTIVE: BP 137/76   Ht 6\' 2"  (1.88 m)   Wt (!) 341 lb (154.7 kg)   BMI 43.78 kg/m   Physical Exam:  Vital signs are reviewed.  GEN: Alert and oriented, NAD Pulm: Breathing unlabored PSY: normal mood, congruent affect  MSK: R Knee -no obvious effusion or erythema overlying the knee.  Non tender to palpation at the medial or lateral joint lines.  Full range of motion of flexion and extension.  No ligamentous instability with valgus or varus stressing.  Negative Lachman's test.  Negative Thessaly test.  Neurovascular intact distally.  L Knee - no obvious effusion or erythema overlying the knee.  Non tender to palpation at the medial or lateral joint lines.  Full range of  motion of flexion and extension.  No ligamentous instability with valgus or varus stressing.  Negative Lachman's test.  Negative Thessaly test.  Neurovascular intact distally.  ASSESSMENT & PLAN:  1. Bilateral Knee Pain -Likely due to arthritis.  We discussed conservative options including oral anti-inflammatories, physical therapy, corticosteroid injections.  He wishes to proceed with bilateral corticosteroid injections today.  See procedure note for details.  Also gave him home exercise plan for quad strengthening.  He did not want prescription oral anti-inflammatories or x-rays today.  He can follow-up with as needed.  If not improving, I would recommend obtaining baseline x-rays to evaluate joint space then possibly repeating corticosteroid injections or moving onto viscosupplementation.  He could then also try a prescription oral anti-inflammatory for maintenance as well.  Procedure Note Bilateral knee intraarticular corticosteroid injection; palpation guided  R Knee Consent obtained and verified.  Timeout performed. Noted no overlying erythema, induration, or other signs of local infection. The Right lateral joint space was palpated and marked. The overlying skin was prepped in a sterile fashion. Topical analgesic spray: Ethyl chloride. Needle: 25 gauge, 1.5 inch Completed without difficulty. Meds: 40 mg methylrprednisolone, 3 ml 1% lidocaine without epinephrine  L Knee Consent obtained and verified.  Timeout performed. Noted no  overlying erythema, induration, or other signs of local infection. The Left lateral joint space was palpated and marked. The overlying skin was prepped in a sterile fashion. Topical analgesic spray: Ethyl chloride. Needle: 25 gauge, 1.5 inch Completed without difficulty. Meds: 40 mg methylrprednisolone, 3 ml 1% lidocaine without epinephrine  Advised to call if fevers/chills, erythema, induration, drainage, or persistent bleeding.    Arvella Nigh,  MD PGY-4, Sports Medicine Fellow [facility]

## 2022-04-14 NOTE — Patient Instructions (Signed)
Your pain is due to arthritis. These are the different medications you can take for this: Tylenol 500mg  1-2 tabs three times a day for pain. Capsaicin, aspercreme, or biofreeze topically up to four times a day may also help with pain. Some supplements that may help for arthritis: Boswellia extract, curcumin, pycnogenol Aleve 1-2 tabs twice a day with food if needed Cortisone injections are an option - you were given these today. If cortisone injections do not help, there are different types of shots that may help but they take longer to take effect. It's important that you continue to stay active. Straight leg raises, knee extensions 3 sets of 10 once a day (add ankle weight if these become too easy). Consider physical therapy to strengthen muscles around the joint that hurts to take pressure off of the joint itself. Shoe inserts with good arch support may be helpful. Heat or ice 15 minutes at a time 3-4 times a day as needed to help with pain. Water aerobics and cycling with low resistance are the best two types of exercise for arthritis though any exercise is ok as long as it doesn't worsen the pain. Follow up with me in 1 month or as needed if you're doing well.

## 2022-04-19 ENCOUNTER — Ambulatory Visit: Payer: BC Managed Care – PPO | Admitting: Family Medicine

## 2022-05-12 ENCOUNTER — Ambulatory Visit: Payer: BC Managed Care – PPO | Admitting: Family Medicine

## 2022-08-07 DIAGNOSIS — M25561 Pain in right knee: Secondary | ICD-10-CM | POA: Diagnosis not present

## 2022-08-25 DIAGNOSIS — E1169 Type 2 diabetes mellitus with other specified complication: Secondary | ICD-10-CM | POA: Diagnosis not present

## 2022-08-25 DIAGNOSIS — Z113 Encounter for screening for infections with a predominantly sexual mode of transmission: Secondary | ICD-10-CM | POA: Diagnosis not present

## 2022-08-25 DIAGNOSIS — I10 Essential (primary) hypertension: Secondary | ICD-10-CM | POA: Diagnosis not present

## 2022-08-25 DIAGNOSIS — Z0000 Encounter for general adult medical examination without abnormal findings: Secondary | ICD-10-CM | POA: Diagnosis not present

## 2022-08-26 DIAGNOSIS — E1169 Type 2 diabetes mellitus with other specified complication: Secondary | ICD-10-CM | POA: Diagnosis not present

## 2022-08-26 DIAGNOSIS — Z113 Encounter for screening for infections with a predominantly sexual mode of transmission: Secondary | ICD-10-CM | POA: Diagnosis not present

## 2022-08-26 DIAGNOSIS — Z0000 Encounter for general adult medical examination without abnormal findings: Secondary | ICD-10-CM | POA: Diagnosis not present

## 2022-08-26 DIAGNOSIS — Z13220 Encounter for screening for lipoid disorders: Secondary | ICD-10-CM | POA: Diagnosis not present

## 2022-08-26 DIAGNOSIS — Z125 Encounter for screening for malignant neoplasm of prostate: Secondary | ICD-10-CM | POA: Diagnosis not present

## 2022-09-12 DIAGNOSIS — R5383 Other fatigue: Secondary | ICD-10-CM | POA: Diagnosis not present

## 2022-09-12 DIAGNOSIS — R051 Acute cough: Secondary | ICD-10-CM | POA: Diagnosis not present

## 2022-09-12 DIAGNOSIS — R6883 Chills (without fever): Secondary | ICD-10-CM | POA: Diagnosis not present

## 2022-09-12 DIAGNOSIS — J029 Acute pharyngitis, unspecified: Secondary | ICD-10-CM | POA: Diagnosis not present

## 2022-09-12 DIAGNOSIS — U071 COVID-19: Secondary | ICD-10-CM | POA: Diagnosis not present

## 2022-09-22 ENCOUNTER — Ambulatory Visit (INDEPENDENT_AMBULATORY_CARE_PROVIDER_SITE_OTHER): Payer: BC Managed Care – PPO | Admitting: Sports Medicine

## 2022-09-22 VITALS — BP 138/82 | Ht 74.0 in | Wt 335.0 lb

## 2022-09-22 DIAGNOSIS — M25561 Pain in right knee: Secondary | ICD-10-CM | POA: Diagnosis not present

## 2022-09-22 MED ORDER — MELOXICAM 15 MG PO TABS: 15.0000 mg | ORAL_TABLET | Freq: Every day | ORAL | 1 refills | Status: DC

## 2022-09-22 NOTE — Assessment & Plan Note (Signed)
He reports some relief with the meloxicam he has been taking but is here today to inquire about prevention and strengthening for his right knee.  He may have sustained a small meniscal injury a couple of weeks ago however he is not having any buckling or catching. He was given a handout with quad exercises to perform 1-2 times a day for the next 4 to 6 weeks.  We discussed cortisone shot however he would like to hold off at this time.  Counseled him to avoid things such as deep squats or reported to.  He verbalized understanding.  Will follow-up with him in 5 to 6 weeks to see how he has been doing with the exercises.  Refill of meloxicam was sent today.  I did counsel him not to take any other nonsteroidal anti-inflammatories without medicine and to increase his water intake.

## 2022-09-22 NOTE — Progress Notes (Signed)
   Established Patient Office Visit  Subjective   Patient ID: Shaun Scott, male    DOB: 12-13-1970  Age: 52 y.o. MRN: [id number]  CC: Right knee pain   HPI: Shaun Scott is a 52 year old male with bilateral chronic knee pain likely 2/2 to OA who presents with worsening right knee pain. He was walking his daughter's dog about a month ago when the dog pulled hard on the leash causing his knee to turn awkwardly. He had trouble walking afterwards. His knee did not significantly swell up at the time. He went to urgent care at [facility] 3 weeks ago and got a knee XR which was reportedly negative for acute fracture. He was offered a steroid injection or meloxicam and decided to take meloxicam as it could benefit both knees. He has been taking meloxicam 15 mg daily since in addition to Tylenol 500 mg. He works two jobs and his second job involves continuous standing overnight which aggravates his knees. He occasionally feels like his right knee is going to buckle but has not had prior falls from this. He typically feels stiff in both knees when he awakens and improves throughout the day.    Objective:     BP 138/82   Ht 6\' 2"  (1.88 m)   Wt (!) 335 lb (152 kg)   BMI 43.01 kg/m  Vital signs reviewed.   Physical Exam  Seated comfortably in exam room, obese. Right knee: No obvious effusion.  Nontender to palpation of medial and lateral joint lines. Crepitus present on bilateral knees with extension. Popping sensation intermittently at left anterolateral knee. Full ROM of motion with flexion, extension, and lateral rotation. Negative anterior drawer. Negative Thessaly test. Normal gait   Assessment & Plan:   Problem List Items Addressed This Visit       Other   Acute pain of right knee - Primary    He reports some relief with the meloxicam he has been taking but is here today to inquire about prevention and strengthening for his right knee.  He may have sustained a small meniscal injury a  couple of weeks ago however he is not having any buckling or catching. He was given a handout with quad exercises to perform 1-2 times a day for the next 4 to 6 weeks.  We discussed cortisone shot however he would like to hold off at this time.  Counseled him to avoid things such as deep squats or reported to.  He verbalized understanding.  Will follow-up with him in 5 to 6 weeks to see how he has been doing with the exercises.  Refill of meloxicam was sent today.  I did counsel him not to take any other nonsteroidal anti-inflammatories without medicine and to increase his water intake.       Return in about 4 weeks (around 10/20/2022).   Sabino Dick MS4 Randye Lobo  I evaluated this patient separately from the medical student and agree with her physical exam and assessment.  Patient is likely suffering from an arthritis flare or possible meniscal injury.  He has had improvement with just oral meloxicam.  He may return to the clinic if no improvement of his symptoms with the physical therapy exercises.  Elmore Guise, DO  Addendum:  I was the preceptor for this visit and available for immediate consultation.  Karlton Lemon MD Kirt Boys

## 2022-10-20 ENCOUNTER — Ambulatory Visit: Payer: BC Managed Care – PPO | Admitting: Sports Medicine

## 2023-02-15 ENCOUNTER — Other Ambulatory Visit (HOSPITAL_COMMUNITY): Payer: Self-pay | Admitting: Family Medicine

## 2023-02-15 DIAGNOSIS — E1159 Type 2 diabetes mellitus with other circulatory complications: Secondary | ICD-10-CM

## 2023-02-15 DIAGNOSIS — H6122 Impacted cerumen, left ear: Secondary | ICD-10-CM | POA: Diagnosis not present

## 2023-02-15 DIAGNOSIS — I10 Essential (primary) hypertension: Secondary | ICD-10-CM | POA: Diagnosis not present

## 2023-02-15 DIAGNOSIS — E1169 Type 2 diabetes mellitus with other specified complication: Secondary | ICD-10-CM | POA: Diagnosis not present

## 2023-02-15 DIAGNOSIS — G4733 Obstructive sleep apnea (adult) (pediatric): Secondary | ICD-10-CM | POA: Diagnosis not present

## 2023-04-26 DIAGNOSIS — R079 Chest pain, unspecified: Secondary | ICD-10-CM | POA: Diagnosis not present

## 2023-04-26 DIAGNOSIS — R0602 Shortness of breath: Secondary | ICD-10-CM | POA: Diagnosis not present

## 2023-04-26 DIAGNOSIS — Z8679 Personal history of other diseases of the circulatory system: Secondary | ICD-10-CM | POA: Diagnosis not present

## 2023-05-31 DIAGNOSIS — Z8679 Personal history of other diseases of the circulatory system: Secondary | ICD-10-CM | POA: Diagnosis not present

## 2023-05-31 DIAGNOSIS — R0602 Shortness of breath: Secondary | ICD-10-CM | POA: Diagnosis not present

## 2023-05-31 DIAGNOSIS — R079 Chest pain, unspecified: Secondary | ICD-10-CM | POA: Diagnosis not present

## 2023-06-21 DIAGNOSIS — R2242 Localized swelling, mass and lump, left lower limb: Secondary | ICD-10-CM | POA: Diagnosis not present

## 2023-06-21 DIAGNOSIS — I10 Essential (primary) hypertension: Secondary | ICD-10-CM | POA: Diagnosis not present

## 2023-06-21 NOTE — Progress Notes (Signed)
 [facility] ESTABLISHED PATIENT VISIT  Patient Name:  Shaun Scott 13-Aug-1971 52 y.o. Date of Encounter:  06/21/2023   PCP:  Kip Beverley Camp, MD Primary Cardiologist: Zachary CROME. Myra, MD   Reason for visit: Evaluate cardiovascular issues; ;Last office visit  04/26/2023  History of Present Illness: Shaun Scott is a very pleasant 52 y.o. male with a history of hypertension returning today for evaluation.  He saw Dr. Myra back in August 2024.  He had struggled with some intermittent lower extremity swelling which he noticed especially after sitting for prolonged period.  He wanted to be proactive to make sure his heart was okay.  Dr. Myra ordered a stress echocardiogram which was unremarkable.  He denies new acute concerns today.  No CP, SOB, palpitations.  No progressive lower extremity swelling concerns.  He does have a history of varicose veins.   He stays active.  No tobacco use.  Blood pressures are well-controlled.  Review of Systems: 12-point review of systems otherwise negative with pertinent positives in the HPI.  Past Medical History: Past Medical History:  Diagnosis Date  . Hypertension     Surgical History: No past surgical history on file.  Family History: Family History  Problem Relation Age of Onset  . Hypertension Other   . Other (States that he really doesn't know all of his family members) Other     Social History: Social History   Tobacco Use  Smoking Status Never  Smokeless Tobacco Never   Social History   Substance and Sexual Activity  Alcohol Use Not Currently   Social History   Substance and Sexual Activity  Drug Use Not on file       Occupational History  . Not on file    Medications: Current Outpatient Medications on File Prior to Visit  Medication Sig Dispense Refill  . amlodipine (NORVASC) 10 MG tablet Take 1 tablet (10 mg total) by mouth daily.    . hydroCHLOROthiazide (HYDRODIURIL) 25 MG tablet Take 1 tablet (25 mg total)  by mouth daily.     No current facility-administered medications on file prior to visit.    Allergies: Allergies  Allergen Reactions  . Venom-Honey Bee Swelling  . Aspirin Hives    Nose bleed  . Penicillins Rash    Childhood allergy   Physical Exam Blood pressure 141/78, pulse 69, height 188 cm (6' 2), weight (!) 150.4 kg (331 lb 9.6 oz).   Constitutional: Well developed, well nourished, in no acute distress Eyes: Lids are normal without ptosis, edema. Conjunctivae are normal and without inflammation, injection, hemorrhages or exudates. Ears/Nose/Mouth/Throat: Oropharynx clear without erythema, lesions. Good dentition.  Neck: No JVD. No cervical or supraclavicular lymphadenopathy. No thyromegaly. Trachea midline.  Cardiovascular: RRR. No murmurs, rubs, or gallops. PMI not displaced. No thrills, lifts, or heaves.  Carotid pulses: 2+ bilaterally without bruits.  Abdominal aorta: No abnormalities, no bruit auscultated.  Extremities: Warm, dry. Radial pulses 2+, Pedal pulses: 2+ bilaterally. No clubbing, cyanosis. Right posterior leg venous stasis changes. No erythema or ttp.  Respiratory: Normal respiratory effort with symmetrical lung expansion. CTAB.  GI: Normal bowel sounds. Soft, non-tender, non-distended. No obvious masses. Musculoskeletal: Muscle strength and tone normal. No atrophy noted. Neuro: Grossly intact, nonfocal. Move all 4 extremities equally/appropriately.  Skin: Skin is warm, moist, soft, elastic, and without rash or lesions  Neurologic: Alert and oriented X 3.  Psychiatric: Mood and affect appropriate. Hematologic/Lymphatic/Immunologic: No signs of bleeding or excessive bruising.  ACCESSORY CLINICAL FINDINGS: EKG 04/26/2023 shows  NSR at a rate of 69 bpm.  Stress echocardiogram 05/31/2023 normal LVEF, >55%, normal RV size/function, no valvular pathology.  No electrocardiographic or echocardiographic changes concerning for ischemia.  He exercised for 7 minutes and  30 seconds on Bruce protocol superseding target heart rate.  ASSESSMENT/PLAN: 1.  Hypertension:  BP Readings from Last 3 Encounters:  06/21/23 141/78  04/26/23 142/76  -Typically well controlled. Goal BP 130/82mmHg or less. Monitor home BP/HR BID and record. Titrate meds to goal.  -Home Meds: Continue amlodipine 10 mg daily and hydrochlorothiazide 25 mg daily normal. No changes today.  -Ensure low sodium diet, less than 2000mg /day.   2. LE edema, hx of varicose veins: Seems to be a stable issue.  I reassured him regarding cardiovascular status.  Amlodipine could contribute to mild vasodilatory edema.  Monitor for now.  Ensure low-sodium diet.  Compression socks may also be helpful.  We could consider vascular surgery eval for noninvasive venous management if indicated.  Conservative management is planned at this time.  3.  Health maintenance: Focus on heart healthy diet and regular exercise to facilitate weight loss.  BMI is 42 which is above goal.  LDL goal <100.  Follow up: PRN  Thank you very much for the opportunity to participate in the care of Mr. Raffo. If you have any questions, please do not hesitate to contact our office.   Alan Lamp Wende Blush PA-C Physician Assistant for Dr. Zachary Clause [facility]([place], [facility] [place], [place] office locations) [facility]

## 2023-07-05 ENCOUNTER — Encounter: Payer: Self-pay | Admitting: Podiatry

## 2023-07-05 ENCOUNTER — Ambulatory Visit (INDEPENDENT_AMBULATORY_CARE_PROVIDER_SITE_OTHER): Payer: BC Managed Care – PPO | Admitting: Podiatry

## 2023-07-05 DIAGNOSIS — B070 Plantar wart: Secondary | ICD-10-CM

## 2023-07-05 NOTE — Patient Instructions (Signed)
Check with your insurance if they cover custom molded foot orthotics (Code [id number])

## 2023-07-08 ENCOUNTER — Encounter: Payer: Self-pay | Admitting: Podiatry

## 2023-07-08 NOTE — Progress Notes (Signed)
  Subjective:  Patient ID: Shaun Scott, male    DOB: Oct 08, 1970,  MRN: [id number]  Chief Complaint  Patient presents with   Callouses    PATIENT STATES THAT HE HAS A CALLOUSES ON THE BOTTOM OF THE CORNER OF HIS LF , IT HAS BEEN ON THERE FOR ABOUT 4 TO 5 MONTHS AND WANTS TO KNOW IF HE CAN GET A ORTHOTICS DONE. PATIENT STATES IT DOESN'T HURT A LOT UNLESS HE BANGS IT AGAINST SOMETHING , NO MEDICATION FOR PAIN .    52 y.o. male presents with the above complaint. History confirmed with patient.   Objective:  Physical Exam: warm, good capillary refill, no trophic changes or ulcerative lesions, normal DP and PT pulses, normal sensory exam, and verruca plantaris left submetatarsal 1  Assessment:  No diagnosis found.   Plan:  Patient was evaluated and treated and all questions answered.  Discussed etiology and treatment of verruca plantaris in detail with the patient as well as multiple treatment options including blistering agents, chemotherapeutic agents, surgical excision, laser therapy and the indications and roles of the above.  Today, recommended treatment with Cantharone as noted in procedure note below.  Follow-up in 1 month for reevaluation  Procedure: Destruction of Lesion Location: Left foot submetatarsal 1 Instrumentation: 15 blade. Technique: Debridement of lesion to petechial bleeding. Aperture pad applied around lesion. Small amount of canthrone applied to the base of the lesion. Dressing: Dry, sterile, compression dressing. Disposition: Patient tolerated procedure well. Advised to leave dressing on for 12 hours. Thereafter patient to wash the area with soap and water and applied band-aid. Off-loading pads dispensed. Patient to return in 3 weeks for follow-up.   Return in about 1 month (around 08/04/2023) for wart/corn treatment.

## 2023-08-04 ENCOUNTER — Ambulatory Visit (INDEPENDENT_AMBULATORY_CARE_PROVIDER_SITE_OTHER): Payer: BC Managed Care – PPO | Admitting: Podiatry

## 2023-08-04 DIAGNOSIS — Z91199 Patient's noncompliance with other medical treatment and regimen due to unspecified reason: Secondary | ICD-10-CM

## 2023-08-06 NOTE — Progress Notes (Signed)
Patient was no-show for appointment today 

## 2023-09-21 DIAGNOSIS — Z0000 Encounter for general adult medical examination without abnormal findings: Secondary | ICD-10-CM | POA: Diagnosis not present

## 2023-11-10 DIAGNOSIS — E1169 Type 2 diabetes mellitus with other specified complication: Secondary | ICD-10-CM | POA: Diagnosis not present

## 2023-11-10 DIAGNOSIS — Z125 Encounter for screening for malignant neoplasm of prostate: Secondary | ICD-10-CM | POA: Diagnosis not present

## 2023-11-10 DIAGNOSIS — Z0000 Encounter for general adult medical examination without abnormal findings: Secondary | ICD-10-CM | POA: Diagnosis not present

## 2024-01-27 ENCOUNTER — Ambulatory Visit (HOSPITAL_COMMUNITY): Admission: RE | Admit: 2024-01-27 | Source: Ambulatory Visit

## 2024-01-27 ENCOUNTER — Encounter (HOSPITAL_COMMUNITY): Payer: Self-pay

## 2024-01-27 ENCOUNTER — Other Ambulatory Visit (HOSPITAL_COMMUNITY): Payer: Self-pay | Admitting: Family Medicine

## 2024-01-27 DIAGNOSIS — R600 Localized edema: Secondary | ICD-10-CM | POA: Diagnosis not present

## 2024-01-27 DIAGNOSIS — Z6841 Body Mass Index (BMI) 40.0 and over, adult: Secondary | ICD-10-CM | POA: Diagnosis not present

## 2024-01-27 DIAGNOSIS — I10 Essential (primary) hypertension: Secondary | ICD-10-CM | POA: Diagnosis not present

## 2024-02-09 ENCOUNTER — Ambulatory Visit (HOSPITAL_COMMUNITY)
Admission: RE | Admit: 2024-02-09 | Discharge: 2024-02-09 | Disposition: A | Discharge status: Home / Self Care | Source: Ambulatory Visit | Attending: Family Medicine | Admitting: Family Medicine

## 2024-02-09 DIAGNOSIS — R600 Localized edema: Secondary | ICD-10-CM

## 2024-04-10 ENCOUNTER — Encounter: Payer: Self-pay | Admitting: Neurology

## 2024-04-10 ENCOUNTER — Ambulatory Visit (INDEPENDENT_AMBULATORY_CARE_PROVIDER_SITE_OTHER): Admitting: Neurology

## 2024-04-10 ENCOUNTER — Other Ambulatory Visit: Payer: Self-pay | Admitting: Neurology

## 2024-04-10 VITALS — BP 135/84 | HR 68 | Wt 331.8 lb

## 2024-04-10 DIAGNOSIS — E66813 Obesity, class 3: Secondary | ICD-10-CM

## 2024-04-10 DIAGNOSIS — G4733 Obstructive sleep apnea (adult) (pediatric): Secondary | ICD-10-CM | POA: Diagnosis not present

## 2024-04-10 DIAGNOSIS — Z6841 Body Mass Index (BMI) 40.0 and over, adult: Secondary | ICD-10-CM

## 2024-04-10 DIAGNOSIS — G4726 Circadian rhythm sleep disorder, shift work type: Secondary | ICD-10-CM

## 2024-04-10 NOTE — Patient Instructions (Signed)
 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   They have a higher risk of hypertension and diabetes mellitus.    In addition Dr. Myra his cardiologist states that the patient has well-controlled blood pressures has an active lifestyle and no tobacco use no palpitations or shortness of breath.  He has lower extremity swelling  attributed to Norvasc.   2) Obesity and large neck, small airway.  Risk for OSA .   3)  the patient feels that his settings no longer cover his pressure needs  , and currently set 5-12 cm water with 3 cm EPR but 95% pressure as at 11. 2 cm water,  need to lift the settings.   Plan :   I will reset the current machine and order new supplies , and then a new HST sleep study  is ordered for October  2025.  I will order modafinil for shift - work  fatigue.   I plan to follow up either personally or through our NP within 6 months.

## 2024-04-10 NOTE — Progress Notes (Signed)
 SLEEP MEDICINE CLINIC    Provider:  Dedra Gores, MD  Primary Care Physician:  Kip Righter, MD [address] [place] [address]     Referring Provider: Kip Righter, Md [address] [place],  [place] [address]          Chief Complaint according to patient   Patient presents with:     New Patient (Initial Visit)           HISTORY OF PRESENT ILLNESS:  Shaun Scott is a 53 y.o. male patient who is seen upon a new referral  by dr Kip on 04/10/2024 for a new Sleep apnea evaluation.  Chief concern according to patient :         The patient had the first sleep study by [facility],  12-14-2017,  with a result of an AHI ( Apnea Hypopnea index)  of 6.9/h,  an oxygen saturation Nadir at SP02 79%.  [name]'s home sleep test followed by a set up for a CPAP machine this is an [employer] AirSense 10 device and it was set up in May 2019.  He had recently used it sporadically, the minimum pressure is set at 5 the maximum pressure still set at 12 cm water with 3 cm expiratory pressure relief.  The apnea-hypopnea index is only 0.2/h so the residual apnea is very low and the 91st percentile air leak exceeds what would be normal the pressure at the 91st percentile is 11 cm water.  Mr. Train BMI is 15.  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    Social history:  Patient lives in a household with daughter and wife,  wife has  been in renal failure and had transplant.   Pets are not present. Tobacco / Nicotine use: none   ETOH use ; none ,  Caffeine intake in form of Coffee( 1-2 a day) Soda(  some) Tea ( /) , but uses energy drinks to be alert when driving.  Exercise in form of walking .      Sleep habits are as follows: The patient's dinner time is  5 PM and a snack at 4 AM at work, next  8 AM breakfast , 1  Pm snack.  . The patient goes to bed at 8 AM  to 12 noon, and continues to sleep and has a 30 minute nap at break time.   The preferred sleep position is lateral, with the support of 2 pillows.  Dreams are reportedly frequent    The patient wakes up spontaneously in AM  and after a nap with an alarm.  He reports not feeling refreshed or restored  he only gets 5 hours at work days and 8 hours on a weekend.    Review of Systems: Out of a complete 14 system review, the patient complains of only the following symptoms, and all other reviewed systems are negative.:  Fatigue, sleepiness , snoring, Nocturia    How likely are you to doze in the following situations: 0 = not likely, 1 = slight chance, 2 = moderate chance, 3 = high chance   Sitting and Reading? Watching Television? Sitting inactive in a public place (theater or meeting)? As a passenger in a car for an hour without a break? Lying  down in the afternoon when circumstances permit? Sitting and talking to someone? Sitting quietly after lunch without alcohol? In a car, while stopped for a few minutes in traffic?   Total = 4/ 24 points   FSS endorsed at 28/ 63 points.     Social History   Socioeconomic History   Marital status: Married    Spouse name: Not on file   Number of children: Not on file   Years of education: Not on file   Highest education level: Not on file  Occupational History   Not on file  Tobacco Use   Smoking status: Never   Smokeless tobacco: Never  Substance and Sexual Activity   Alcohol use: No   Drug use: No   Sexual activity: Yes  Other Topics Concern   Not on file  Social History Narrative   Not on file   Social Drivers of Health   Financial Resource Strain: Not on file  Food Insecurity: Not on file  Transportation Needs: Not on file  Physical Activity: Not on file  Stress: Not on file  Social Connections: Not on file    Family History  Problem Relation Age of Onset   Hypertension  Mother    Diabetes Mother    CVA Mother    Kidney cancer Mother    Asthma Father    Asthma Brother    Testicular cancer Brother    Prostate cancer Brother     Past Medical History:  Diagnosis Date   Elevated BP 10/2011   reading with out dx of HTN   Hypertension    Low back pain    Followed by Chiropractor   Obesity    Obstructive sleep apnea     Past Surgical History:  Procedure Laterality Date   TONSILECTOMY, ADENOIDECTOMY, BILATERAL MYRINGOTOMY AND TUBES       Current Outpatient Medications on File Prior to Visit  Medication Sig Dispense Refill   amLODipine (NORVASC) 2.5 MG tablet Take 2.5 mg by mouth daily.     hydrochlorothiazide (HYDRODIURIL) 25 MG tablet Take 25 mg by mouth daily.     No current facility-administered medications on file prior to visit.    Allergies  Allergen Reactions   Asa [Aspirin]     Nose bleed    Bee Venom Swelling   Penicillins     Childhood allergy      DIAGNOSTIC DATA (LABS, IMAGING, TESTING) - I reviewed patient records, labs, notes, testing and imaging myself where available.  HST 2019  No results found for: WBC, HGB, HCT, MCV, PLT No results found for: NA, K, CL, CO2, GLUCOSE, BUN, CREATININE, CALCIUM, PROT, ALBUMIN, AST, ALT, ALKPHOS, BILITOT, GFRNONAA, GFRAA No results found for: CHOL, HDL, LDLCALC, LDLDIRECT, TRIG, CHOLHDL No results found for: YHAJ8R No results found for: VITAMINB12 No results found for: TSH  See [facility] in [facility] tap   PHYSICAL EXAM:  Today's Vitals   04/10/24 1432  BP: 135/84  Pulse: 68  Weight: (!) 331 lb 12.8 oz (150.5 kg)   Body mass index is 42.6 kg/m.   Wt Readings from Last 3 Encounters:  04/10/24 (!) 331 lb 12.8 oz (150.5 kg)  09/22/22 (!) 335 lb (152 kg)  04/14/22 (!) 341 lb (154.7 kg)     Ht Readings from Last 3 Encounters:  09/22/22 6' 2 (1.88 m)  04/14/22 6' 2 (1.88 m)  11/18/21 6' 2 (1.88 m)       General: The patient is awake, alert and appears  not in acute distress. The patient is well groomed. Head: Normocephalic, atraumatic. Neck is supple.  Mallampati 3 plus ,  neck circumference:19 inches . Nasal airflow is not fully  patent.   Dental status:  biological  Cardiovascular:  Regular rate and cardiac rhythm by pulse,  without distended neck veins. Respiratory: Lungs are clear to auscultation.  Skin:  With evidence of ankle edema, no rash  Trunk: BMI 43   NEUROLOGIC EXAM: The patient is awake and alert, oriented to place and time.   Memory subjective described as intact.  Attention span & concentration ability appears normal.  Speech is fluent,  without  dysarthria, dysphonia or aphasia.  Mood and affect are appropriate.   Cranial nerves: no loss of smell or taste reported  Pupils are equal and briskly reactive to light. Funduscopic exam deferred .  Extraocular movements in vertical and horizontal planes were intact and without nystagmus. No Diplopia. Visual fields by finger perimetry are intact. Hearing was intact .    Facial sensation intact to fine touch.  Facial motor strength is symmetric , tongue is midline.  Neck ROM : rotation, tilt and flexion extension were normal for age and shoulder shrug was symmetrical.    Motor exam:  Symmetric bulk, tone and ROM.   Normal tone without cog -wheeling, symmetric and normal  grip strength .   Sensory:  Fine touch and vibration : normal.  Proprioception tested in the upper extremities was normal.   Coordination: Rapid alternating movements in the fingers/hands were of normal speed.  The Finger-to-nose maneuver was intact without evidence of ataxia, dysmetria or tremor.   Gait and station: Patient could rise unassisted from a seated position, walked without assistive device.   Deep tendon reflexes: in the  upper and lower extremities are symmetric and intact.  Babinski response was deferred .    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   They have a higher risk of hypertension and diabetes mellitus.    In addition Dr. Myra his cardiologist states that the patient has well-controlled blood pressures has an active lifestyle and no tobacco use no palpitations or shortness of breath.  He has lower extremity swelling  attributed to Norvasc.   2) Obesity and large neck, small airway.  Risk for OSA .   3)  the patient feels that his settings no longer cover his pressure needs  , and currently set 5-12 cm water with 3 cm EPR but 95% pressure as at 11. 2 cm water,  need to lift the settings.   Plan :   I will reset the current machine and order new supplies , and then a new HST sleep study  is ordered for September/ October  2025.  I will order modafinil for shift - work  fatigue.   I plan to follow up either personally or through our NP within 6 months.   I would like to thank Kip Righter, MD or allowing me to meet with and to take care of this pleasant patient.   Discussion of sleep hygiene setting bedtime and rise time,  hot shower  before bed  time, no screen light in the bedroom, the bedroom should be cool, quiet and dark. Night lights should illuminate the floor not shine into your eyes. Golden glow  light is less intrusive than blue or cold light.  Read in a book with pages, not on a device. Consider audio books and soothing  sound -scapes.    After spending a total time of  45  minutes face to face and additional time for physical and neurologic examination, review of laboratory studies,  personal review of imaging studies, reports and results of other testing and review of referral information / records  as far as provided in visit,   Electronically signed by: Dedra Gores, MD 04/10/2024 3:23 PM  [facility] Board certified by The [employer] of Sleep Medicine and Diplomate of the [employer] of Sleep Medicine. Board certified In Neurology through the ABPN, Fellow of the [employer] of Neurology.

## 2024-04-24 ENCOUNTER — Ambulatory Visit: Admitting: Podiatry

## 2024-05-01 ENCOUNTER — Ambulatory Visit: Admitting: Podiatry

## 2024-05-01 DIAGNOSIS — R600 Localized edema: Secondary | ICD-10-CM | POA: Diagnosis not present

## 2024-06-05 ENCOUNTER — Ambulatory Visit (INDEPENDENT_AMBULATORY_CARE_PROVIDER_SITE_OTHER): Admitting: Neurology

## 2024-06-05 DIAGNOSIS — G4726 Circadian rhythm sleep disorder, shift work type: Secondary | ICD-10-CM

## 2024-06-05 DIAGNOSIS — G4733 Obstructive sleep apnea (adult) (pediatric): Secondary | ICD-10-CM | POA: Diagnosis not present

## 2024-06-05 DIAGNOSIS — Z6841 Body Mass Index (BMI) 40.0 and over, adult: Secondary | ICD-10-CM

## 2024-06-05 DIAGNOSIS — E66813 Obesity, class 3: Secondary | ICD-10-CM

## 2024-06-06 NOTE — Progress Notes (Signed)
 [facility]   HOME SLEEP TEST REPORT ( by Watch PAT)   STUDY DATE:  06-05-2024  Shaun Scott 53 year old male [birth year]    ORDERING CLINICIAN: Dedra Gores, MD  REFERRING CLINICIAN: [employer], MD    CLINICAL INFORMATION/HISTORY: This patient was set up with CPAP in 2019, [employer] was his DME at the time . He has a renal transplant, bilateral ankle and leg edema,  a high BMI and  DM2, HTN, chest pain and SOB.  The patient had the first sleep study by [facility],  12-14-2017,  with a result of an AHI ( Apnea Hypopnea index)  of 6.9/h,  an oxygen saturation Nadir at SP02 79%.  Shaun Scott's home sleep test followed by a set up for a CPAP machine this is an [employer] AirSense 10 device and it was set up in May 2019.  He had recently used it sporadically, the minimum pressure is set at 5 the maximum pressure still set at 12 cm water with 3 cm expiratory pressure relief.  The apnea-hypopnea index is only 0.2/h so the residual apnea is very low and the 91st percentile air leak exceeds what would be normal the pressure at the 91st percentile is 11 cm water.   Shaun Scott BMI is 60.  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  the patient feels that his settings no longer cover his pressure needs  , and currently set 5-12 cm water with 3 cm EPR but 95% pressure as at 11. 2 cm water,  need to lift the settings.    Plan :   I will reset the current machine and order new supplies , and then a new HST sleep study  is ordered for September/ October  2025.  I will order modafinil for shift - work  fatigue. Discussion of sleep hygiene setting bedtime and rise time      Epworth sleepiness score: 4 /24. FSS at 28/ 63 points    BMI: 42 kg/m   Neck Circumference: 19    FINDINGS:   Sleep Summary:   Total Recording Time (hours, min):    8 h 33 m     Total Sleep Time (hours, min):    7 h 8 m             Percent REM (%):    21%                                    Respiratory Indices:   Calculated pAHI (per AASM or CMS guideline): scored by CMS guidelines    15.8/h ( moderate obstructive sleep apnea)  There were no central events                       REM pAHI:    7.3/h                                               NREM pAHI:  18/h  Positional AHI:   supine AHI was 28.4/h  and non supine sleep was 5/h (!)     Snoring:   The mean volume was 41 db and is mild .                                            Oxygen Saturation Statistics:   Oxygen Saturation (%) Mean:  93%             O2 Saturation Range (%):         between 74 % at nadir and 98%                               O2 Saturation (minutes) <89%:   8.4 minutes        Pulse Rate Statistics:   Pulse Mean (bpm):    65 bpm             Pulse Range:     between 53 and 101 bpm              IMPRESSION:  This HST confirms the presence of  moderate -severe obstructive sleep apnea ( OSA)  and  this is strongly positional dependent.  Avoiding supine sleep can significantly reduce the apnea -hypopnea burden.  ( I was surprised about the inverted REM versus NREM AHI )   RECOMMENDATION: Continuous CPAP therapy is indicated . The new autotitration device by [employer] will be set between 7 and 16 cm water pressure , with 2 cm EPR and mask of patients choice , allowing him to sleep on his side or prone.    Important is to avoid sleeping on the back  Further necessary is to pursue weight loss.    The degree of OSA found here in conjunction with the degree of obesity would allow for use of zepbound by FDA criteria. FDA approval  of the medication for use in obesity and moderate-severe OSA however does not mean that insurance will cover the  medication.     Any patient should be cautioned not to drive, work at heights, or operate dangerous or heavy  equipment when tired or sleepy.   Review of good sleep hygiene measures is accessible to any sleep clinic patient and can be reiterated through online material- I we recommend the Guide to better Sleep   by the NIH.   Weight loss and Core Strength improvement is highly recommended for individuals with low muscle tone and/ or a BMI over 30.  Any CPAP patient should be reminded to be fully compliant with PAP therapy , (defined as using PAP therapy for more than 4 hours each night ) with the goal to improve sleep related symptoms and decrease long term cardiovascular risks. Any PAP therapy patient should be reminded, that it may take up to 3 months to get fully used to using PAP and it may take 1-2 weeks for an established CPAP user to acclimatize to changes in pressure or mask. The earlier full compliance is achieved, the better long term compliance tends to be.   Please note that untreated obstructive sleep apnea may carry additional perioperative morbidity. Patients with significant obstructive sleep apnea should receive perioperative PAP therapy and the surgical team should be informed of the diagnosis and degree of sleep disordered breathing.  Sleep fragmentation in the  presence of normal proportional sleep stages is a nonspecific findings and per se does not signify an intrinsic sleep disorder or a cause for the patient's sleep-related symptoms.  Causes include (but are not limited to) the unfamiliarity of sleeping while recorded by HST device or sleeping in a sleep lab for a full Polysomnography sleep study, but also circadian rhythm disturbances, medication side effects or an underlying mood disorder or medical problem.   The referring physician will be notified of the test results.       INTERPRETING PHYSICIAN:   Dedra Gores, MD  [facility] and [facility] Sleep Board certified by The [employer] of Sleep Medicine and Diplomate of the [employer] of Sleep  Medicine. Board certified In Neurology through the ABPN, Fellow of the [employer] of Neurology.

## 2024-06-07 DIAGNOSIS — J40 Bronchitis, not specified as acute or chronic: Secondary | ICD-10-CM | POA: Diagnosis not present

## 2024-06-07 DIAGNOSIS — R0982 Postnasal drip: Secondary | ICD-10-CM | POA: Diagnosis not present

## 2024-06-07 DIAGNOSIS — R062 Wheezing: Secondary | ICD-10-CM | POA: Diagnosis not present

## 2024-06-07 DIAGNOSIS — R051 Acute cough: Secondary | ICD-10-CM | POA: Diagnosis not present

## 2024-06-20 ENCOUNTER — Ambulatory Visit: Payer: Self-pay | Admitting: Neurology

## 2024-06-20 NOTE — Procedures (Signed)
 [facility]   HOME SLEEP TEST REPORT ( by Watch PAT)   STUDY DATE:  06-05-2024  Shaun Scott 53 year old male June 15, 1971    ORDERING CLINICIAN: Dedra Gores, MD  REFERRING CLINICIAN: [employer], MD    CLINICAL INFORMATION/HISTORY: This patient was set up with CPAP in 2019, [employer] was his DME at the time . He has a renal transplant, bilateral ankle and leg edema,  a high BMI and  DM2, HTN, chest pain and SOB.  The patient had the first sleep study by [facility],  12-14-2017,  with a result of an AHI ( Apnea Hypopnea index)  of 6.9/h,  an oxygen saturation Nadir at SP02 79%.  Shaun Scott's home sleep test followed by a set up for a CPAP machine this is an [employer] AirSense 10 device and it was set up in May 2019.  He had recently used it sporadically, the minimum pressure is set at 5 the maximum pressure still set at 12 cm water with 3 cm expiratory pressure relief.  The apnea-hypopnea index is only 0.2/h so the residual apnea is very low and the 91st percentile air leak exceeds what would be normal the pressure at the 91st percentile is 11 cm water.   Shaun Scott BMI is 50.  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  the patient feels that his settings no longer cover his pressure needs  , and currently set 5-12 cm water with 3 cm EPR but 95% pressure as at 11. 2 cm water,  need to lift the settings.    Plan :   I will reset the current machine and order new supplies , and then a new HST sleep study  is ordered for September/ October  2025.  I will order modafinil for shift - work  fatigue. Discussion of sleep hygiene setting bedtime and rise time      Epworth sleepiness score: 4 /24. FSS at 28/ 63 points    BMI: 42 kg/m   Neck Circumference: 19    FINDINGS:   Sleep Summary:   Total Recording Time (hours, min):   8 h  33 m     Total Sleep Time (hours, min):    7 h 8 m             Percent REM (%):    21%                                    Respiratory Indices:   Calculated pAHI (per AASM or CMS guideline): scored by CMS guidelines    15.8/h ( moderate obstructive sleep apnea)  There were no central events                       REM pAHI:    7.3/h                                               NREM pAHI:  18/h  Positional AHI:   supine AHI was 28.4/h  and non supine sleep was 5/h (!)     Snoring:   The mean volume was 41 db and is mild .                                            Oxygen Saturation Statistics:   Oxygen Saturation (%) Mean:  93%             O2 Saturation Range (%):         between 74 % at nadir and 98%                               O2 Saturation (minutes) <89%:   8.4 minutes        Pulse Rate Statistics:   Pulse Mean (bpm):    65 bpm             Pulse Range:     between 53 and 101 bpm              IMPRESSION:  This HST confirms the presence of  moderate -severe obstructive sleep apnea ( OSA)  and  this is strongly positional dependent.  Avoiding supine sleep can significantly reduce the apnea -hypopnea burden.  ( I was surprised about the inverted REM versus NREM AHI )   RECOMMENDATION: Continuous CPAP therapy is indicated . The new autotitration device by [employer] will be set between 7 and 16 cm water pressure , with 2 cm EPR and mask of patients choice , allowing him to sleep on his side or prone.    Important is to avoid sleeping on the back  Further necessary is to pursue weight loss.    The degree of OSA found here in conjunction with the degree of obesity would allow for use of zepbound by FDA criteria. FDA approval  of the medication for use in obesity and moderate-severe OSA however does not mean that insurance will cover the  medication.     Any patient should be cautioned not to drive, work at heights, or operate dangerous or heavy  equipment when tired or sleepy.   Review of good sleep hygiene measures is accessible to any sleep clinic patient and can be reiterated through online material- I we recommend the Guide to better Sleep   by the NIH.   Weight loss and Core Strength improvement is highly recommended for individuals with low muscle tone and/ or a BMI over 30.  Any CPAP patient should be reminded to be fully compliant with PAP therapy , (defined as using PAP therapy for more than 4 hours each night ) with the goal to improve sleep related symptoms and decrease long term cardiovascular risks. Any PAP therapy patient should be reminded, that it may take up to 3 months to get fully used to using PAP and it may take 1-2 weeks for an established CPAP user to acclimatize to changes in pressure or mask. The earlier full compliance is achieved, the better long term compliance tends to be.   Please note that untreated obstructive sleep apnea may carry additional perioperative morbidity. Patients with significant obstructive sleep apnea should receive perioperative PAP therapy and the surgical team should be informed of the diagnosis and degree of sleep disordered breathing.  Sleep fragmentation in the  presence of normal proportional sleep stages is a nonspecific findings and per se does not signify an intrinsic sleep disorder or a cause for the patient's sleep-related symptoms.  Causes include (but are not limited to) the unfamiliarity of sleeping while recorded by HST device or sleeping in a sleep lab for a full Polysomnography sleep study, but also circadian rhythm disturbances, medication side effects or an underlying mood disorder or medical problem.   The referring physician will be notified of the test results.       INTERPRETING PHYSICIAN:   Dedra Gores, MD  [facility] and [facility] Sleep Board certified by The [employer] of Sleep Medicine and Diplomate of the [employer] of Sleep  Medicine. Board certified In Neurology through the ABPN, Fellow of the [employer] of Neurology.

## 2024-06-21 NOTE — Telephone Encounter (Signed)
 Sent message to [employer]

## 2024-06-21 NOTE — Telephone Encounter (Signed)
-----   Message from [place] Dohmeier sent at 06/20/2024  2:27 PM EDT ----- Please tell the patient : Moderate degree of obstructive sleep apnea is still present and will need to be treated by CPAP.  Ordered the new machine for higher pressure settings than the old one.  He has a choice of masks.  He should avoid sleeping on his back !! Weight loss is also important :  PS : The degree of OSA found here in conjunction with the degree of obesity would allow for use of zepbound by FDA criteria. FDA approval  of the medication for use in obesity and moderate-severe OSA however does not mean that insurance will cover the  medication.   ----- Message ----- From: Chalice Saunas, MD Sent: 06/20/2024   2:23 PM EDT To: Saunas Chalice, MD

## 2024-06-21 NOTE — Telephone Encounter (Signed)
 I relayed results of recent sleep study.  Moderated OSA, new machine ordered.  DME [employer].  Avoid sleeping on back.  Qualifies for FDA approval for zepbound if he wishes thru pcp.  He verbalized understanding.  Will call when get new machine to set up f/u appt 2-3 months, insurance compliance/ mandated.

## 2024-06-25 NOTE — Telephone Encounter (Signed)
 RE: new machine Received: 3 days ago New, Adine Neysa Nena GORMAN, RN; Joylene Carlean Sheree Leveda Jackson Easter Delsa Eleanor; 1 other Received, thank you!     Previous Messages    ----- Message ----- From: Neysa Nena GORMAN, RN Sent: 06/21/2024  11:33 AM EDT To: Adine Joylene; Avelina Jackson; Ephraim Dollar* Subject: new machine                                    New order in EPIC  Jerona LOIS Fuelling Male, 53 y.o., 16-Mar-1971 MRN: [id number] Phone: [phone]   Thanks san

## 2024-07-03 NOTE — Telephone Encounter (Signed)
 LVM made patient aware that [facility]  is trying to contact him to set up cpap machine . Left patient [facility]  # for him to return their call

## 2024-07-19 DIAGNOSIS — S90561A Insect bite (nonvenomous), right ankle, initial encounter: Secondary | ICD-10-CM | POA: Diagnosis not present

## 2024-07-19 DIAGNOSIS — W57XXXA Bitten or stung by nonvenomous insect and other nonvenomous arthropods, initial encounter: Secondary | ICD-10-CM | POA: Diagnosis not present

## 2024-07-31 DIAGNOSIS — Z8679 Personal history of other diseases of the circulatory system: Secondary | ICD-10-CM | POA: Diagnosis not present

## 2024-07-31 DIAGNOSIS — R2242 Localized swelling, mass and lump, left lower limb: Secondary | ICD-10-CM | POA: Diagnosis not present

## 2024-09-13 ENCOUNTER — Ambulatory Visit: Admitting: Podiatry

## 2024-09-13 ENCOUNTER — Ambulatory Visit

## 2024-09-13 VITALS — Ht 74.0 in | Wt 331.0 lb

## 2024-09-13 DIAGNOSIS — M62462 Contracture of muscle, left lower leg: Secondary | ICD-10-CM | POA: Diagnosis not present

## 2024-09-13 DIAGNOSIS — M79671 Pain in right foot: Secondary | ICD-10-CM

## 2024-09-13 DIAGNOSIS — M79672 Pain in left foot: Secondary | ICD-10-CM

## 2024-09-13 DIAGNOSIS — M62461 Contracture of muscle, right lower leg: Secondary | ICD-10-CM | POA: Diagnosis not present

## 2024-09-13 DIAGNOSIS — M76822 Posterior tibial tendinitis, left leg: Secondary | ICD-10-CM | POA: Diagnosis not present

## 2024-09-13 DIAGNOSIS — M76821 Posterior tibial tendinitis, right leg: Secondary | ICD-10-CM | POA: Diagnosis not present

## 2024-09-13 NOTE — Progress Notes (Signed)
 Visit:  ORTHOTIC SCAN/ EVALUATION  Patient presented to see Dr. Silva for new orthotics. He was scanned today at his visit.   Patient will benefit from custom foot orthotics to provide total contact to bilateral medial longitudinal arches to help balance and distribute body weight more evenly.  Thus reducing plantar pressure and pain.   Orthotic will encourage forefoot and rearfoot alignment.    Patient was scanned today with OHI scanner.    Orthotics are ordered.  Signature obtained for notification of pricing/ fees for the device.  When the orthotic is ready for pick up, will call to make an appointment for a fitting.    Kolt Mcwhirter, DPM

## 2024-09-16 NOTE — Progress Notes (Signed)
"  °  Subjective:  Patient ID: Shaun Scott, male    DOB: April 22, 1971,  MRN: [id number]  Chief Complaint  Patient presents with   Foot Pain   Foot Orthotics    RM 5 Patient is here requesting new orthotics.  Pt states pain in the arch of the feet when not wearing orthotics and the pain radiates towards the ankles.    54 y.o. male presents with the above complaint. History confirmed with patient.  He presents today for follow-up, having pain in the arches of both feet and in the ball of the foot, bothers him most towards the end of the day he is on his feet and works 2 jobs  Objective:  Physical Exam: warm, good capillary refill, no trophic changes or ulcerative lesions, normal DP and PT pulses, normal sensory exam, and Pes planus deformity.  Gastrinomas equinus.  No pain to palpation on plantar fascia   Radiographs: Multiple views x-ray of both feet: no fracture, dislocation, swelling or degenerative changes noted, he has pes planus deformity Assessment:   1. Posterior tibial tendon dysfunction (PTTD) of left lower extremity   2. Posterior tibial tendon dysfunction (PTTD) of right lower extremity   3. Gastrocnemius equinus of left lower extremity   4. Gastrocnemius equinus of right lower extremity      Plan:  Patient was evaluated and treated and all questions answered.  Pes Planus  - Discussed etiology and treatment options of his pes planus deformity metatarsalgia.  Recommended custom molded foot orthoses to support the medial longitudinal arch to offload the posterior tibial tendon and facilitate soft tissue healing.  He will be notified when they are ready for pickup.  Also discussed home physical therapy and stretching exercises, follow-up as needed OTC NSAIDs as needed.  No follow-ups on file.    "

## 2024-09-20 ENCOUNTER — Ambulatory Visit: Admitting: Podiatry

## 2024-09-25 ENCOUNTER — Ambulatory Visit: Admitting: Podiatry

## 2024-10-24 ENCOUNTER — Encounter: Admitting: Neurology
